# Patient Record
Sex: Male | Born: 1950 | Race: White | Hispanic: No | Marital: Married
Health system: Midwestern US, Community
[De-identification: ages and names within clinical notes are randomized; demographics above are authoritative.]

## PROBLEM LIST (undated history)

## (undated) DIAGNOSIS — K219 Gastro-esophageal reflux disease without esophagitis: Secondary | ICD-10-CM

## (undated) DIAGNOSIS — E785 Hyperlipidemia, unspecified: Secondary | ICD-10-CM

## (undated) DIAGNOSIS — E7800 Pure hypercholesterolemia, unspecified: Secondary | ICD-10-CM

## (undated) DIAGNOSIS — I2510 Atherosclerotic heart disease of native coronary artery without angina pectoris: Secondary | ICD-10-CM

## (undated) DIAGNOSIS — I10 Essential (primary) hypertension: Secondary | ICD-10-CM

## (undated) DIAGNOSIS — M51369 Other intervertebral disc degeneration, lumbar region without mention of lumbar back pain or lower extremity pain: Secondary | ICD-10-CM

## (undated) DIAGNOSIS — R609 Edema, unspecified: Secondary | ICD-10-CM

## (undated) DIAGNOSIS — M5136 Other intervertebral disc degeneration, lumbar region: Secondary | ICD-10-CM

## (undated) DIAGNOSIS — M79605 Pain in left leg: Secondary | ICD-10-CM

## (undated) DIAGNOSIS — M5116 Intervertebral disc disorders with radiculopathy, lumbar region: Principal | ICD-10-CM

## (undated) DIAGNOSIS — I209 Angina pectoris, unspecified: Secondary | ICD-10-CM

## (undated) DIAGNOSIS — R42 Dizziness and giddiness: Secondary | ICD-10-CM

## (undated) DIAGNOSIS — M545 Low back pain, unspecified: Secondary | ICD-10-CM

---

## 2011-05-28 NOTE — Telephone Encounter (Signed)
Per Ginger Reddick, RN, instruct patient he would have to go to the emergency room since he has not been seen by a Provider at our office.  She reported Dr. Doylene Canard was at [facility] this am, therefore it was a possibility he might would see him in the ER.   Also advised his PCP could check about rescheduling for earlier stress test appt, or he could come to the ER at [facility].

## 2011-05-28 NOTE — Telephone Encounter (Signed)
Patient states he has Stress Test scheduled for 06/06/11 at 9:30 am with Dr. Chase Picket.  He feels he does not need to wait that long to find out what is causing his chest pain.  He states he has been having chest pain radiating into his left arm and jaw, but only with physical exertion.  However last night had chest pain while lying in bed.  He reports taking a nitroglycerin tablet SL and pain went away after 4-5 minutes.  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  Informed I would check with Dr. Wende Bushy nurse and return call

## 2011-05-29 ENCOUNTER — Inpatient Hospital Stay: Admit: 2011-05-29 | Disposition: A | Discharge status: Home / Self Care | Attending: Family | Admitting: Family

## 2011-05-29 LAB — CBC PANEL ITEM
Basophils %: 0.7 % (ref 0–1)
Basophils: 0.05 10*3/uL (ref 0.0–0.20)
Eosinophils %: 0.04 10*3/uL (ref 0.0–0.60)
Eosinophils %: 0.5 % — ABNORMAL LOW (ref 1–5)
Hematocrit: 43.5 % (ref 42–52)
Hemoglobin: 15.1 G/DL (ref 14.0–18.0)
Lymphocytes: 1.97 10*3/uL (ref 1.1–4.5)
Lymphocytes: 26.2 % (ref 20–40)
MCH: 30.7 PG (ref 27–31)
MCHC: 34.7 G/DL (ref 33–37)
MCV: 88.4 FL (ref 80–94)
MPV: 10 FL — ABNORMAL LOW (ref 10.2–13.2)
Monocytes %: 7.6 % (ref 0–10)
Monocytes: 0.57 10*3/uL (ref 0.0–0.9)
Neutrophils %: 65 % (ref 50–70)
Neutrophils Absolute: 4.89 10*3/uL (ref 1.5–7.5)
Platelets: 188 (ref 130–400)
RBC: 4.92 MIL/uL (ref 4.7–6.1)
RDW: 13.5 % (ref 11.0–14.0)
WBC: 7.52 10*3/uL (ref 4.8–10.8)

## 2011-05-29 LAB — COMPREHENSIVE PANEL
ALT: 37 IU/L (ref 10–40)
AST: 25 IU/L (ref 8–41)
Albumin: 4.7 G/DL (ref 3.4–4.8)
Alkaline Phosphatase: 95 IU/L (ref 18–125)
Anion Gap: 5 MEQ/L — ABNORMAL LOW (ref 7–19)
BUN: 23 MG/DL — ABNORMAL HIGH (ref 6–20)
CO2: 32 MEQ/L — ABNORMAL HIGH (ref 23–29)
Calcium: 9.7 MG/DL (ref 8.5–10.3)
Chloride: 105 MEQ/L (ref 98–111)
Creatinine: 1.1 MG/DL (ref 0.3–1.3)
Gfr Calculated: 72 mL/min (ref >59)
Glucose: 103 MG/DL
Potassium: 3.8 mEq/L (ref 3.5–5.0)
Sodium: 142 MEQ/L (ref 136–145)
Total Bilirubin: 0.8 MG/DL (ref 0.2–1.2)
Total Protein: 7.3 G/DL (ref 6.4–8.3)

## 2011-05-30 LAB — CARDIAC ED PROFILE
Ck Isoenzymes: 81 IU/L (ref 22–269)
Ck Isoenzymes: 72 IU/L (ref 22–269)
Ck Isoenzymes: 82 IU/L (ref 22–269)
Myoglobin: 38 NG/ML (ref 15–110)
Myoglobin: 66 NG/ML (ref 15–110)
Myoglobin: 71 NG/ML (ref 15–110)
Troponin I: 0.1 NG/ML — ABNORMAL HIGH (ref 0.000–0.06)
Troponin I: 0.16 NG/ML — ABNORMAL HIGH (ref 0.000–0.06)
Troponin I: 0.18 NG/ML — ABNORMAL HIGH (ref 0.000–0.06)

## 2011-05-30 LAB — LIPID PANEL
Cholesterol, Total: 169 MG/DL
HDL: 36 MG/DL
LDL Direct: 109 MG/DL
Triglycerides: 124 MG/DL (ref 35–160)

## 2011-05-30 LAB — PROTIME-INR
INR: 1.07 (ref 0.90–1.10)
Protime: 13.5 s (ref 11.5–14.1)

## 2011-05-30 LAB — C-REACTIVE PROTEIN: CRP: 0.04 MG/DL (ref 0–0.5)

## 2011-05-30 LAB — LACTATE DEHYDROGENASE: LD: 160 IU/L (ref 91–215)

## 2011-05-30 LAB — HEMOGLOBIN A1C: %HBA1C: 6 % (ref 4.0–6.0)

## 2011-05-31 LAB — CARDIAC ED PROFILE
Ck Isoenzymes: 66 IU/L (ref 22–269)
Myoglobin: 49 NG/ML (ref 15–110)
Troponin I: 0.13 NG/ML — ABNORMAL HIGH (ref 0.000–0.06)

## 2011-06-01 LAB — URINALYSIS
Bilirubin Urine: NEGATIVE
Glucose, Ur: NEGATIVE MG/DL
Ketones, Urine: NEGATIVE MG/DL
Leukocyte Esterase, Urine: NEGATIVE
Nitrite, Urine: NEGATIVE
Occult Blood,Urine: NEGATIVE
Protein, UA: NEGATIVE MG/DL
Specific Gravity, Urine: 1.025 (ref 1.001–1.035)
Urobilinogen, Urine: 1 EU/DL
pH, UA: 6.5

## 2011-06-01 LAB — CBC PANEL ITEM
Basophils %: 0.9 % (ref 0–1)
Basophils: 0.06 10*3/uL (ref 0.0–0.20)
Eosinophils %: 0.17 10*3/uL (ref 0.0–0.60)
Eosinophils %: 2.6 % (ref 1–5)
Hematocrit: 42.5 % (ref 42–52)
Hemoglobin: 14.8 G/DL (ref 14.0–18.0)
Lymphocytes: 2.01 10*3/uL (ref 1.1–4.5)
Lymphocytes: 30.6 % (ref 20–40)
MCH: 31 PG (ref 27–31)
MCHC: 34.8 G/DL (ref 33–37)
MCV: 88.9 FL (ref 80–94)
MPV: 10.5 FL (ref 10.2–13.2)
Monocytes %: 8.7 % (ref 0–10)
Monocytes: 0.57 10*3/uL (ref 0.0–0.9)
Neutrophils %: 57.2 % (ref 50–70)
Neutrophils Absolute: 3.75 10*3/uL (ref 1.5–7.5)
Platelets: 171 (ref 130–400)
RBC: 4.78 MIL/uL (ref 4.7–6.1)
RDW: 13.1 % (ref 11.0–14.0)
WBC: 6.56 10*3/uL (ref 4.8–10.8)

## 2011-06-01 LAB — CARDIAC ED PROFILE
Ck Isoenzymes: 49 IU/L (ref 22–269)
Myoglobin: 41 NG/ML (ref 15–110)
Troponin I: 0.12 NG/ML — ABNORMAL HIGH (ref 0.000–0.06)

## 2011-06-02 LAB — BLOOD GAS, ARTERIAL
Base: -4.6 — ABNORMAL LOW (ref <=2.0)
Base: -5.9 — ABNORMAL LOW (ref <=2.0)
Base: -8.9 — ABNORMAL LOW (ref <=2.0)
CALCULATED TOTAL (E+NE): 11.4 g/dL — ABNORMAL LOW (ref 14.0–18.0)
CALCULATED TOTAL (E+NE): 11.3 g/dL — ABNORMAL LOW (ref 14.0–18.0)
CALCULATED TOTAL (E+NE): 12.4 g/dL — ABNORMAL LOW (ref 14.0–18.0)
Carboxyhemoglobin: 0.9 % (ref 0–5)
Carboxyhemoglobin: 0.3 % (ref 0–5)
Carboxyhemoglobin: 0.1 % (ref 0–5)
FIO2: 15
FIO2: 3
HCO3: 22.5 mM/L (ref 22–26)
HCO3: 21.1 mM/L — ABNORMAL LOW (ref 22–26)
HCO3: 18.4 mM/L — ABNORMAL LOW (ref 22–26)
Met HB: 0.6 % (ref 0–3)
Met HB: 1.1 % (ref 0–3)
Met HB: 0.7 % (ref 0–3)
O2 Sat: 15.6 % — ABNORMAL LOW (ref 17–21)
O2 Sat: 15.3 % — ABNORMAL LOW (ref 17–21)
O2 Sat: 16.7 % — ABNORMAL LOW (ref 17–21)
PCO2: 49 mmHg — ABNORMAL HIGH (ref 35–48)
PCO2: 47 mmHg (ref 35–48)
PCO2: 44 mmHg (ref 35–48)
PO2: 283 mmHg — ABNORMAL HIGH (ref 83–108)
PO2: 147 mmHg — ABNORMAL HIGH (ref 83–108)
PO2: 100 mmHg (ref 83–108)
PT Status: 2
Ph: 7.27 — CL (ref 7.35–7.45)
Ph: 7.26 — CL (ref 7.35–7.45)
Ph: 7.23 — CL (ref 7.35–7.45)
Potassium, Bld: 4.2 mEq/L (ref 3.5–5.0)
Potassium, Bld: 4.4 mEq/L (ref 3.5–5.0)
Potassium, Bld: 4.5 mEq/L (ref 3.5–5.0)
Rate: 18
Rate: 19
Rate: 14
Sat: 98.5 % (ref 90–100)
Sat: 97.5 % (ref 90–100)
Sat: 97.3 % (ref 90–100)

## 2011-06-02 LAB — EG7+
Base Excess (Calculated): -1 mmol/L (ref ?–2)
Base Excess (Calculated): -1 mmol/L (ref ?–2)
Base Excess (Calculated): -2 mmol/L (ref ?–2)
Base Excess (Calculated): 1 mmol/L (ref ?–2)
HCO3: 23.3 mmol/L (ref 22–26)
HCO3: 23.6 mmol/L (ref 22–26)
HCO3: 24 mmol/L (ref 22–26)
HCO3: 25.2 mmol/L (ref 22–26)
Hematocrit: 33 %PCV (ref 37–52)
Hematocrit: 38 %PCV (ref 37–52)
Hematocrit: 39 %PCV (ref 37–52)
Hematocrit: 39 %PCV (ref 37–52)
Hemoglobin: 11.2 g/dL (ref 12–18)
Hemoglobin: 12.9 g/dL (ref 12–18)
Hemoglobin: 13.3 g/dL (ref 12–18)
Hemoglobin: 13.3 g/dL (ref 12–18)
O2 Sat: 100 % (ref 95–98)
O2 Sat: 100 % (ref 95–98)
O2 Sat: 100 % (ref 95–98)
O2 Sat: 99 % (ref 95–98)
PCO2: 35.1 mmHg (ref 35–48)
PCO2: 36.8 mmHg (ref 35–48)
PCO2: 40.7 mmHg (ref 35–48)
PCO2: 43.9 mmHg (ref 35–48)
POC Ionized Calcium: 1 mmol/L (ref 1.10–1.30)
POC Ionized Calcium: 1.05 mmol/L (ref 1.10–1.30)
POC Ionized Calcium: 1.06 mmol/L (ref 1.10–1.30)
POC Ionized Calcium: 1.14 mmol/L (ref 1.10–1.30)
Potassium: 3.5 mEq/L (ref 3.5–5.0)
Potassium: 3.6 mEq/L (ref 3.5–5.0)
Potassium: 3.7 mEq/L (ref 3.5–5.0)
Potassium: 3.9 mEq/L (ref 3.5–5.0)
Sodium: 135 mEq/L (ref 136–145)
Sodium: 136 mEq/L (ref 136–145)
Sodium: 137 mEq/L (ref 136–145)
Sodium: 138 mEq/L (ref 136–145)
TC02 (Calc), Ven: 24 mmol/L (ref 23–29)
TC02 (Calc), Ven: 25 mmol/L (ref 23–29)
TC02 (Calc), Ven: 25 mmol/L (ref 23–29)
TC02 (Calc), Ven: 26 mmol/L (ref 23–29)
pH:: 7.339 (ref 7.35–7.45)
pH:: 7.38 (ref 7.35–7.45)
pH:: 7.408 (ref 7.35–7.45)
pH:: 7.465 (ref 7.35–7.45)
pO2, Arterial: 163 mmHg (ref 83–108)
pO2, Arterial: 326 mmHg (ref 83–108)
pO2, Arterial: 332 mmHg (ref 83–108)
pO2, Arterial: 502 mmHg (ref 83–108)

## 2011-06-02 LAB — BASIC METABOLIC PANEL
Anion Gap: 1 MEQ/L — ABNORMAL LOW (ref 7–19)
Anion Gap: 4 MEQ/L — ABNORMAL LOW (ref 7–19)
BUN: 14 MG/DL (ref 6–20)
BUN: 16 MG/DL (ref 6–20)
CO2: 24 MEQ/L (ref 23–29)
CO2: 28 MEQ/L (ref 23–29)
Calcium: 7.9 MG/DL — ABNORMAL LOW (ref 8.5–10.3)
Calcium: 8.7 MG/DL (ref 8.5–10.3)
Chloride: 110 MEQ/L (ref 98–111)
Chloride: 110 MEQ/L (ref 98–111)
Creatinine: 0.9 MG/DL (ref 0.3–1.3)
Creatinine: 0.9 MG/DL (ref 0.3–1.3)
Gfr Calculated: 91 mL/min (ref >59)
Gfr Calculated: 91 mL/min (ref >59)
Glucose: 144 MG/DL
Glucose: 159 MG/DL
Potassium: 4.5 mEq/L (ref 3.5–5.0)
Potassium: 4.7 mEq/L (ref 3.5–5.0)
Sodium: 138 MEQ/L (ref 136–145)
Sodium: 139 MEQ/L (ref 136–145)

## 2011-06-02 LAB — CBC PANEL ITEM
Basophils %: 0.3 % (ref 0–1)
Basophils: 0.06 10*3/uL (ref 0.0–0.20)
Eosinophils %: 0.1 10*3/uL (ref 0.0–0.60)
Eosinophils %: 0.5 % — ABNORMAL LOW (ref 1–5)
Hematocrit: 30.7 % — ABNORMAL LOW (ref 42–52)
Hemoglobin: 10.6 G/DL — ABNORMAL LOW (ref 14.0–18.0)
Lymphocytes: 13.4 % — ABNORMAL LOW (ref 20–40)
Lymphocytes: 2.63 10*3/uL (ref 1.1–4.5)
MCH: 31.1 PG — ABNORMAL HIGH (ref 27–31)
MCHC: 34.5 G/DL (ref 33–37)
MCV: 90 FL (ref 80–94)
MPV: 10.1 FL — ABNORMAL LOW (ref 10.2–13.2)
Monocytes %: 7.3 % (ref 0–10)
Monocytes: 1.43 10*3/uL — ABNORMAL HIGH (ref 0.0–0.9)
Neutrophils %: 78.5 % — ABNORMAL HIGH (ref 50–70)
Neutrophils Absolute: 15.36 10*3/uL — ABNORMAL HIGH (ref 1.5–7.5)
Platelets: 214 (ref 130–400)
RBC: 3.41 MIL/uL — ABNORMAL LOW (ref 4.7–6.1)
RDW: 13 % (ref 11.0–14.0)
WBC: 19.58 10*3/uL — ABNORMAL HIGH (ref 4.8–10.8)

## 2011-06-02 LAB — APTT: PTT: 29.9 s (ref 23.4–36.4)

## 2011-06-02 LAB — HEMOGLOBIN AND HEMATOCRIT
Hematocrit: 31.2 % — ABNORMAL LOW (ref 42–52)
Hemoglobin: 10.7 G/DL — ABNORMAL LOW (ref 14.0–18.0)

## 2011-06-02 LAB — PROTIME-INR
INR: 1.42 — ABNORMAL HIGH (ref 0.90–1.10)
Protime: 16.6 s — ABNORMAL HIGH (ref 11.5–14.1)

## 2011-06-02 LAB — GLUCOSE TOLERANCE TEST: Glucose Accuchek: 119 MG/DL

## 2011-06-03 LAB — BLOOD GAS, ARTERIAL
Base: -3.9 — ABNORMAL LOW (ref <=2.0)
CALCULATED TOTAL (E+NE): 9.7 g/dL — ABNORMAL LOW (ref 14.0–18.0)
Carboxyhemoglobin: 1.1 % (ref 0–5)
HCO3: 22.2 mM/L (ref 22–26)
Met HB: 0.9 % (ref 0–3)
O2 Sat: 13.1 % — ABNORMAL LOW (ref 17–21)
PCO2: 44 mmHg (ref 35–48)
PO2: 91 mmHg (ref 83–108)
Ph: 7.31 — ABNORMAL LOW (ref 7.35–7.45)
Potassium, Bld: 4.2 mEq/L (ref 3.5–5.0)
Rate: 16
Sat: 96.6 % (ref 90–100)

## 2011-06-03 LAB — BASIC METABOLIC PANEL
Anion Gap: 4 MEQ/L — ABNORMAL LOW (ref 7–19)
BUN: 15 MG/DL (ref 6–20)
CO2: 25 MEQ/L (ref 23–29)
Calcium: 7.9 MG/DL — ABNORMAL LOW (ref 8.5–10.3)
Chloride: 109 MEQ/L (ref 98–111)
Creatinine: 1 MG/DL (ref 0.3–1.3)
Gfr Calculated: 81 mL/min (ref >59)
Glucose: 140 MG/DL
Potassium: 4.4 mEq/L (ref 3.5–5.0)
Sodium: 138 MEQ/L (ref 136–145)

## 2011-06-03 LAB — CBC PANEL ITEM
Basophils %: 0.1 % (ref 0–1)
Basophils: 0.01 10*3/uL (ref 0.0–0.20)
Eosinophils %: 0 % — ABNORMAL LOW (ref 1–5)
Eosinophils %: 0 10*3/uL — ABNORMAL LOW (ref 0.0–0.60)
Hematocrit: 26.6 % — ABNORMAL LOW (ref 42–52)
Hemoglobin: 9 G/DL — ABNORMAL LOW (ref 14.0–18.0)
Lymphocytes: 1.25 10*3/uL (ref 1.1–4.5)
Lymphocytes: 11.4 % — ABNORMAL LOW (ref 20–40)
MCH: 30.6 PG (ref 27–31)
MCHC: 33.8 G/DL (ref 33–37)
MCV: 90.5 FL (ref 80–94)
MPV: 10.1 FL — ABNORMAL LOW (ref 10.2–13.2)
Monocytes %: 7.6 % (ref 0–10)
Monocytes: 0.83 10*3/uL (ref 0.0–0.9)
Neutrophils %: 80.9 % — ABNORMAL HIGH (ref 50–70)
Neutrophils Absolute: 8.9 10*3/uL — ABNORMAL HIGH (ref 1.5–7.5)
Platelets: 162 (ref 130–400)
RBC: 2.94 MIL/uL — ABNORMAL LOW (ref 4.7–6.1)
RDW: 13.7 % (ref 11.0–14.0)
WBC: 10.99 10*3/uL — ABNORMAL HIGH (ref 4.8–10.8)

## 2011-06-03 LAB — AST: AST: 47 IU/L — ABNORMAL HIGH (ref 8–41)

## 2011-06-03 LAB — GLUCOSE TOLERANCE TEST
Glucose Accuchek: 105 MG/DL
Glucose Accuchek: 130 MG/DL
Glucose Accuchek: 131 MG/DL
Glucose Accuchek: 145 MG/DL
Glucose Accuchek: 160 MG/DL
Glucose Accuchek: 80 MG/DL
Glucose Accuchek: 116 MG/DL
Glucose Accuchek: 112 MG/DL
Glucose Accuchek: 110 MG/DL
Glucose Accuchek: 126 MG/DL

## 2011-06-03 LAB — LACTATE DEHYDROGENASE: LD: 166 IU/L (ref 91–215)

## 2011-06-03 LAB — TROPONIN: Troponin I: 0.5 NG/ML — ABNORMAL HIGH (ref 0.000–0.06)

## 2011-06-03 LAB — CK: Ck Isoenzymes: 994 IU/L — ABNORMAL HIGH (ref 22–269)

## 2011-06-04 LAB — COMPREHENSIVE PANEL
ALT: 40 IU/L (ref 10–40)
AST: 50 IU/L — ABNORMAL HIGH (ref 8–41)
Albumin: 3 G/DL — ABNORMAL LOW (ref 3.4–4.8)
Alkaline Phosphatase: 49 IU/L (ref 18–125)
Anion Gap: 2 MEQ/L — ABNORMAL LOW (ref 7–19)
BUN: 12 MG/DL (ref 6–20)
CO2: 29 MEQ/L (ref 23–29)
Calcium: 8.3 MG/DL — ABNORMAL LOW (ref 8.5–10.3)
Chloride: 110 MEQ/L (ref 98–111)
Creatinine: 0.9 MG/DL (ref 0.3–1.3)
Gfr Calculated: 91 mL/min (ref >59)
Glucose: 112 MG/DL
Potassium: 3.6 mEq/L (ref 3.5–5.0)
Sodium: 141 MEQ/L (ref 136–145)
Total Bilirubin: 0.3 MG/DL (ref 0.2–1.2)
Total Protein: 4.5 G/DL — ABNORMAL LOW (ref 6.4–8.3)

## 2011-06-04 LAB — CBC PANEL ITEM
Basophils %: 0.3 % (ref 0–1)
Basophils: 0.02 10*3/uL (ref 0.0–0.20)
Eosinophils %: 0.03 10*3/uL (ref 0.0–0.60)
Eosinophils %: 0.5 % — ABNORMAL LOW (ref 1–5)
Hematocrit: 21.3 % — ABNORMAL LOW (ref 42–52)
Hemoglobin: 7.1 G/DL — ABNORMAL LOW (ref 14.0–18.0)
Lymphocytes: 1.4 10*3/uL (ref 1.1–4.5)
Lymphocytes: 22.5 % (ref 20–40)
MCH: 30.1 PG (ref 27–31)
MCHC: 33.3 G/DL (ref 33–37)
MCV: 90.3 FL (ref 80–94)
MPV: 10 FL — ABNORMAL LOW (ref 10.2–13.2)
Monocytes %: 9.8 % (ref 0–10)
Monocytes: 0.61 10*3/uL (ref 0.0–0.9)
Neutrophils %: 66.9 % (ref 50–70)
Neutrophils Absolute: 4.15 10*3/uL (ref 1.5–7.5)
Platelets: 127 — ABNORMAL LOW (ref 130–400)
RBC: 2.36 MIL/uL — ABNORMAL LOW (ref 4.7–6.1)
RDW: 14 % (ref 11.0–14.0)
WBC: 6.21 10*3/uL (ref 4.8–10.8)

## 2011-06-04 LAB — GLUCOSE TOLERANCE TEST
Glucose Accuchek: 134 MG/DL
Glucose Accuchek: 114 MG/DL
Glucose Accuchek: 110 MG/DL
Glucose Accuchek: 163 MG/DL
Glucose Accuchek: 153 MG/DL

## 2011-06-04 LAB — ANTIBODY SCREEN/XM

## 2011-06-04 LAB — RH PHENOTYPING
ABO Check: A POS
ABO Check: A POS

## 2011-06-05 ENCOUNTER — Encounter

## 2011-06-05 LAB — BASIC METABOLIC PANEL
Anion Gap: 6 MEQ/L — ABNORMAL LOW (ref 7–19)
BUN: 15 MG/DL (ref 6–20)
CO2: 32 MEQ/L — ABNORMAL HIGH (ref 23–29)
Calcium: 8.4 MG/DL — ABNORMAL LOW (ref 8.5–10.3)
Chloride: 105 MEQ/L (ref 98–111)
Creatinine: 1 MG/DL (ref 0.3–1.3)
Gfr Calculated: 81 mL/min (ref >59)
Glucose: 109 MG/DL
Potassium: 3.6 mEq/L (ref 3.5–5.0)
Sodium: 143 MEQ/L (ref 136–145)

## 2011-06-05 LAB — CBC PANEL ITEM
Basophils %: 0.5 % (ref 0–1)
Basophils: 0.03 10*3/uL (ref 0.0–0.20)
Eosinophils %: 0.13 10*3/uL (ref 0.0–0.60)
Eosinophils %: 2.1 % (ref 1–5)
Hematocrit: 26.2 % — ABNORMAL LOW (ref 42–52)
Hemoglobin: 8.8 G/DL — ABNORMAL LOW (ref 14.0–18.0)
Lymphocytes: 1.52 10*3/uL (ref 1.1–4.5)
Lymphocytes: 24.5 % (ref 20–40)
MCH: 29.5 PG (ref 27–31)
MCHC: 33.6 G/DL (ref 33–37)
MCV: 87.9 FL (ref 80–94)
MPV: 10 FL — ABNORMAL LOW (ref 10.2–13.2)
Monocytes %: 11.1 % — ABNORMAL HIGH (ref 0–10)
Monocytes: 0.69 10*3/uL (ref 0.0–0.9)
Neutrophils %: 61.8 % (ref 50–70)
Neutrophils Absolute: 3.84 10*3/uL (ref 1.5–7.5)
Platelets: 135 (ref 130–400)
RBC: 2.98 MIL/uL — ABNORMAL LOW (ref 4.7–6.1)
RDW: 15.1 % — ABNORMAL HIGH (ref 11.0–14.0)
WBC: 6.21 10*3/uL (ref 4.8–10.8)

## 2011-06-05 LAB — GLUCOSE TOLERANCE TEST
Glucose Accuchek: 164 MG/DL
Glucose Accuchek: 111 MG/DL
Glucose Accuchek: 133 MG/DL

## 2011-06-05 MED ORDER — ROSUVASTATIN CALCIUM 10 MG PO TABS
10 MG | ORAL_TABLET | Freq: Every evening | ORAL | Status: DC
Start: 2011-06-05 — End: 2011-07-14

## 2011-06-05 NOTE — Progress Notes (Signed)
Received notification that Livalo needed prior authorization. Spoke with [employer] at [facility] - explained that Mr. Bernerd PhoKuhens had severe leg pain, unable to get out of bed when he took Simvastatin in the past. Jay SchlichterElmer said based on criteria, Mr. Bernerd PhoKuhens would have to try Crestor first.     Will d/c Livalo and send RX for crestor 10 mg daily to [facility].

## 2011-06-17 NOTE — Patient Instructions (Signed)
Hold Lisinopril and Lopressor  Check Blood Pressure daily

## 2011-06-17 NOTE — Progress Notes (Signed)
Mr. Richard Sims comes in today for evaluation of dizziness and weak. Checked pulse last night - 64.     Blood pressure 137/93, pulse 93, resp. rate 20, SpO2 99.00%.  Chest: clear, decreased left. Left anterior thoracotomy incision clean, moderate amount bruising and swelling.  HT: Regular rate and rhythm. No murmurs, gallops or rubs    Plan: Hold lisinopril and lopressor for now.            Call in a few days with update of symptoms and blood pressure.

## 2011-07-08 ENCOUNTER — Encounter

## 2011-07-08 NOTE — Progress Notes (Addendum)
[facility]  [facility]  [address], [address], [place], [place] [address]  Phone: [phone]  Fax: [phone]      Office Visit:  07/08/2011    Richard Sims - DOB: 10-31-1950    Reason For Visit:  Richard Sims is a 60 y.o. male who is here for Post-Op Check  S/P Hybrid MICS CABG X 1, Stent to RCA    Subjective       I had the pleasure of seeing Richard Sims in the office today. As you know, Richard Sims is a 60 y.o. male who presented with crescendo angina symptoms. A positive stress test was noted; therefore, Richard Sims underwent elective cardiac catheterization. This demonstrated a left ventricle that showed normal function with ejection fraction estimated at 70%. The left main artery had no significant disease. The LAD had a proximal complex 90% stenosis after a large proximal LAD aneurysm. The left circumflex system has a large intermediate branch with a fair obtuse marginal branch. The RCA has a proximal 99% subtotal occlusion at the RV branch. Richard Sims underwent hybrid revascularization, with minimally invasive coronary surgery (MICS), placing the left internal mammary artery to the left anterior descending artery via left mini-thoracotomy followed with PCI and drug-eluting stent placement to the proximal RCA per Dr. [name] on 06/02/2011.    Richard Sims did well during the postoperative course. Richard Sims suffered no complications and was discharged home on 06/05/2011.     Since his return home, Richard Sims has continued to increase his ambulation as much as possible, now walking several times per day. His appetite is improving and Richard Sims otherwise has the normal amount of postoperative discomfort.    ALLERGIES: Simvastatin    Medications:   1. Plavix 75 mg daily   2. ASA 81 mg daily   3. Metoprolol 12.5 mg nightly   4. Crestor 10 mg every evening    Richard Sims is to continue the Plavix per Dr. Sandi Mariscal.    Review of Systems:   General: Denies any fever or chills. Energy level and endurance are  returning to                              normal.    Respiratory: Denies any cough or hoarseness. Denies any SOB or DOE.   Cardiac: Denies any chest pain or pressure. Denies any palpitations. Denies any                             presyncope or syncope. Denies any orthopnea or PND.     Physical Examination:   Vital signs: Blood pressure 143/91, pulse 94, resp. rate 20, height 5\' 7"  (1.702 m), weight 186 lb (84.369 kg), SpO2 99.00%.   Lungs: Both lungs are clear with equal breath sounds.   Cardiac: Demonstrates a regular rate and rhythm without murmurs, rubs, or                              gallops.    Chest: The left anterior thoracotomy incision has healed well.              Lower Extremities: No edema.     Assessment:   At this time, Richard Sims is progressing very well following his hybrid revascularization procedure.        Plan:  Richard Sims is also advised to begin the cardiac rehab program as soon as possible.    At this time, I will defer medical management to your discretion. Please call me if you have any questions regarding his care.

## 2011-07-08 NOTE — Patient Instructions (Signed)
We are committed to providing you with the best care possible.   In order to help [place] achieve these goals please remember to bring all medications, herbal products, and over the counter supplements with you to each visit.     If your provider has ordered testing for you, please be sure to follow up with our office if you have not received results within 7 days after the testing took place.     *If you receive a survey after visiting one of our offices, please take time to share your experience concerning your physician office visit. These surveys are confidential and no health information about you is shared.  We are eager to improve for you and we are counting on your feedback to help make that happen.  A copy of the American Heart Association low cholesterol diet is provided, and the diet is discussed with the patient.   Patient was instructed to begin cardiac rehab. Pamphlet was provided. Cardiovascular nutrition handout also provided.

## 2011-07-14 MED ORDER — CLOPIDOGREL BISULFATE 75 MG PO TABS
75 MG | ORAL_TABLET | Freq: Every day | ORAL | Status: DC
Start: 2011-07-14 — End: 2012-08-02

## 2011-07-14 MED ORDER — METOPROLOL TARTRATE 25 MG PO TABS
25 MG | ORAL_TABLET | Freq: Every evening | ORAL | Status: DC
Start: 2011-07-14 — End: 2012-01-12

## 2011-07-14 NOTE — Patient Instructions (Addendum)
1. Continue all your current medications.  2. You will need to stay on plavix for one year.  3. You will need to have your cholesterol rechecked in one month (you were given orders for this).  4. Continue Cardiac Rehab.  5. Call with any questions, problems or concerns.  6. Follow up with your primary care provider for other health concerns.  7. Continue a heart healthy diet.  8. Exercise as tolerated.   9. Follow up with Dr. Sandi Mariscal as scheduled.    You have been scheduled for an ultrasound of your neck arteries on Monday 07/21/11 @ 11:00 am.  Please check in at the [facility] after Cardiac Rehab.  You may eat and take your medications prior to the test.  Learning About Coronary Artery Disease (CAD)  What is coronary artery disease?  Coronary artery disease (CAD) occurs when the arteries that bring oxygen-rich blood to your heart become narrow or blocked. This usually happens when plaque builds up in the arteries. Plaque is fatty material made of cholesterol, calcium, and other substances in the blood.  What happens when you have coronary artery disease?   Narrowed arteries cause poor blood flow, which can lead to chest pain or discomfort. If blood flow is completely blocked, you could have a heart attack.   You can slow CAD and reduce the risk of future problems by making changes in your lifestyle, such as quitting smoking and eating heart-healthy foods.   Treatments for CAD, along with changes in your lifestyle, can help you live a longer and healthier life.  What are the symptoms?  The most common symptoms of coronary artery disease are:   Chest pain or discomfort, also called angina. These feelings may start in or spread to the left shoulder, neck, or lower jaw.   Shortness of breath when exercising or during another vigorous activity.  Others symptoms include:   A fast heartbeat.   Weakness, dizziness, and feeling sick to your stomach (nausea).   Increased sweating.  How can you  prevent coronary artery disease?   Do not smoke. It may be the best thing you can do to prevent heart disease. If you need help quitting, talk to your doctor about stop-smoking programs and medicines. These can increase your chances of quitting for good.   Be active. Get at least 30 minutes of exercise on most days of the week. Walking is a good choice. You also may want to do other activities, such as running, swimming, cycling, or playing tennis or team sports.   Eat heart-healthy foods. Eat more fruits and vegetables and less foods that contain fat and cholesterol. Limit salt and alcohol.   Control your cholesterol and blood pressure. Activity and eating heart-healthy foods can help you do this. You might take medicines too.   Talk to your doctor about taking a daily aspirin.   Manage stress. Stress can hurt your heart. Keep stress low by talking about your problems and feelings, rather than keeping your feelings hidden.  How is coronary artery disease treated?   Your doctor will suggest making lifestyle changes. For example, your doctor may ask you to eat healthy foods, quit smoking, lose extra weight, and be more active.   You will have to take medicines.   Your doctor may suggest procedures such as opening blocked arteries (angioplasty) or using healthy blood vessels to create detours around narrowed or blocked arteries (bypass surgery).  Follow-up care is a key part of your treatment  and safety. Be sure to make and go to all appointments, and call your doctor if you are having problems. It's also a good idea to know your test results and keep a list of the medicines you take.    Where can you learn more?    Go to [email or web address] and sign in to your MyChart account.    Enter C643 in the Search Health Information box to learn more about "Learning About Coronary Artery Disease (CAD)."    If you do not have an account, please click on the "Sign Up Now" link.     2006-2012  [employer]. Care instructions adapted under license by [facility]. This care instruction is for use with your licensed healthcare professional. If you have questions about a medical condition or this instruction, always ask your healthcare professional. [employer], Inc[employer] disclaims any warranty or liability for your use of this information.  Content Version: 9.4.94723; Last Revised: May 13, 2010

## 2011-07-14 NOTE — Progress Notes (Signed)
[facility] [place] [place]  [address], [place]  [address]  Phone: [phone]  Fax: [phone]    OFFICE VISIT:  07/14/2011    Richard Sims - DOB: 30-Jan-1951    Reason For Visit:  Richard Sims is a 60 y.o. male who is here for Follow-Up from Hospital    Overall, the patient is doing well from a cardiac standpoint.  He underwent MICS placing LIMA to LAD on 06/02/11.  He also had stenting to RCA.  The patient states "I feel great".  He is presently in cardiac rehabilitation.  He was started on Lipitor during the hospitalization and will need lab in one month.  The patient is concerned as his mother has carotid occlusive disease.  He reports some occasional dizziness without syncope or presyncope.    Subjective  Richard Sims denies exertional chest pain, shortness of breath, orthopnea, paroxysmal nocturnal dyspnea, syncope, presyncope, arrythmia, fatigue and edema.  The patient denies numbness or weakness to suggest cerebrovascular accident or transient ischemic attack.  Postive for occasional dizziness.    Richard Sims has the following history as recorded in EpicCare:    Patient Active Problem List   Diagnoses Date Noted   . S/P CABG x 1    . CAD (coronary artery disease)    . Hyperlipidemia    . Hypertension      Past Medical History   Diagnosis Date   . GERD (gastroesophageal reflux disease)    . Cervical disc disease    . CAD (coronary artery disease)    . Hyperlipidemia    . Hypertension    . Nephrolithiasis    . S/P CABG x 1      MICS with LIMA to LAD via left mini thoracotomy     Past Surgical History   Procedure Date   . Cardiac catherization 05/29/11   [facility]     EF  over 60%   . Coronary artery bypass graft 06/02/2011      HYBRID MICS CABG, LT THORACOTOMY APPROACH, LIMA-LAD, PCI DES-RCA   . Joint replacement      LEFT KNEE   . Spine surgery      ACF X 2   . Knee arthroscopy      BILATERAL     Family History   Problem Relation Age of Onset   . High Blood Pressure Mother    .  Heart Disease Father    . High Blood Pressure Father    . High Cholesterol Father    . Depression Paternal Grandmother      History   Substance Use Topics   . Smoking status: Never Smoker    . Smokeless tobacco: Never Used   . Alcohol Use: Yes      Occassional      Current Outpatient Prescriptions   Medication Sig Dispense Refill   . atorvastatin (LIPITOR) 40 MG tablet Take 40 mg by mouth daily.         . celecoxib (CELEBREX) 200 MG capsule Take 200 mg by mouth 2 times daily.         Marland Kitchen esomeprazole (NEXIUM) 40 MG capsule Take 40 mg by mouth every morning (before breakfast).         . clopidogrel (PLAVIX) 75 MG tablet Take 1 tablet by mouth daily.  90 tablet  3   . metoprolol (LOPRESSOR) 25 MG tablet Take 0.5 tablets by mouth nightly.  60 tablet  3   .  aspirin 81 MG EC tablet Take 81 mg by mouth daily.         . Polysaccharide Iron (FERREX) 150 MG CAPS Take 150 mg by mouth daily.         Marland Kitchen ketorolac (TORADOL) 10 MG tablet Take 10 mg by mouth 3 times daily. FOR 3 DAYS          Allergies: Simvastatin    Review of Systems  Constitutional - no significant activity change, appetite change, or unexpected weight change. No fever, chill or diaphoresis.  No fatigue.   HEENT - no significant rhinorrhea or epistaxis. No tinnitus or significant hearing loss.   Eyes - no sudden vision change or amaurosis.   Respiratory - no significant wheezing, stridor, apnea or cough.  No dyspnea on exertion or shortness of breath,  Cardiovascular - no chest pain, orthopnea, PND, palpitations, fast heart rate or slow heast rate.No claudication or leg edema.  Positive for occasional dizziness.  Gastrointestinal - no abdominal swelling or pain. No blood in stool. No severe constipation, diarrhea, nausea, or vomiting.   Genitourinary - No difficulty urinating, dysuria, frequency, or urgency. No flank pain or hematuria.   Musculoskeletal - no back pain, gait disturbance, or myalgia.   Skin - no color change, rash or pallor.  Left chest surgical  incision well healed without erythema, edema or drainage.  Neurologic - no speech difficulty, facial asymmetry or lateralizing weakness.  No seizures, presyncope or syncope.  Positive for occasional dizziness.  Hematologic - no easy bruising or excessive bleeding.   Psychiatric - no severe anxiety or nervousness. No confusion.   All other review of systems are negative.    Objective  Vital Signs - BP 148/62  Pulse 60  Ht 5\' 7"  (1.702 m)  Wt 185 lb (83.915 kg)  BMI 28.97 kg/m2  General - Richard Sims is alert, cooperative, and pleasant.  No acute distress.      Integument - Skin warm and dry without rash or pallor.  Left chest surgical incision well healed without erythema, edema or drainage.  HEENT - Head normocephalic and atraumatic.  Neck - Neck supple.  No jugular venous distention. No carotid bruits.   Lungs - Lungs clear to auscultation bilaterally.  No wheezes, ronchi or rales.    Cardiovascular - Heart regular rate and rhythm without rub or gallop; no murmur.  No lower extremity edema.     Abdominal -  No distention, pain or ascites.  Musculoskeletal - Normal range of motion.  Neurological - No localizing neurological deficits.   Alert and oriented to person, place and time.    Assessment:   Stable cardiovascular status.   1. Hyperlipidemia  Lipid panel, AST, ALT   2. CAD (coronary artery disease)     3. Hypertension     4. S/P CABG x 1     5.  Mild dizziness (family hx of carotid occlusive disease)    Plan  1. Continue all your current medications.  2. You will need to stay on plavix for one year.  3. You will need to have your cholesterol rechecked in one month (you were given orders for this).  4. Continue Cardiac Rehab.  5. Call with any questions, problems or concerns.  6. Follow up with your primary care provider for other health concerns.  7. Continue a heart healthy diet.  8. Exercise as tolerated.   9. Follow up with Dr. Sandi Mariscal as scheduled.    You have been scheduled for an  ultrasound of your neck  arteries on Monday 07/21/11 @ 11:00 am.  Please check in at the [facility] after Cardiac Rehab.  You may eat and take your medications prior to the test.    Aloha Gell, APRN

## 2011-07-16 ENCOUNTER — Encounter

## 2011-08-22 NOTE — Telephone Encounter (Signed)
C/o pain in legs w/ cholesterol meds niaspan 500 one every hs low fat snack 30 min prior take with asa

## 2011-08-25 ENCOUNTER — Encounter

## 2011-08-25 MED ORDER — NIACIN ER (ANTIHYPERLIPIDEMIC) 500 MG PO TBCR
500 MG | ORAL_TABLET | Freq: Every evening | ORAL | Status: DC
Start: 2011-08-25 — End: 2011-11-28

## 2011-11-03 NOTE — Patient Instructions (Signed)
Register at the [facility] located on the first floor of [facility].    Date/Time:   March 28th.   Register at 7:30 am for an 8:00 am.     Stress Echocardiogram  And Echocardiogram     This records the heart's activity during a cardiac stress test.  A stress echocardiogram is a very effective, noninvasive test that can help determine whether you have blockages in your coronary arteries.    The exam takes approximately thirty minutes.      To help ensure accurate results, patients should take the following steps in preparation for a stress echocardiogram:   Refrain from strenuous activity for 12 hours before the test.   Do not eat, drink, or smoke for two hours prior to the test.  Unless instructed otherwise by your physician, you should continue to take prescribed medications.   Wear loose, comfortable clothing and walking shoes.    If you need to change this appointment, please call outpatient scheduling at [phone].

## 2011-11-03 NOTE — Progress Notes (Signed)
[facility] [place] [place].  [facility]  [address], [place]  [address]  Phone: [phone]  Fax: [phone]  Office Visit - 11/03/2011    Richard Sims  Male, 61 y.o., DOB: [birth year]    Subjective:   Richard Sims is going through cardiac rehab and his doing well.  However, he feels short of breath with activity and wonders if this is normal.      Richard Sims has the following history as recorded in EpicCare:  Patient Active Problem List    Diagnosis Date Noted   . S/P CABG x 1    . CAD (coronary artery disease)    . Hyperlipidemia    . Hypertension      Past Medical History   Diagnosis Date   . GERD (gastroesophageal reflux disease)    . Cervical disc disease    . CAD (coronary artery disease)    . Hyperlipidemia    . Hypertension    . Nephrolithiasis    . S/P CABG x 1      MICS with LIMA to LAD via left mini thoracotomy     Past Surgical History   Procedure Laterality Date   . Cardiac catherization  05/29/11   [facility]     EF  over 60%   . Coronary artery bypass graft  06/02/2011      HYBRID MICS CABG, LT THORACOTOMY APPROACH, LIMA-LAD, PCI DES-RCA   . Joint replacement       LEFT KNEE   . Spine surgery       ACF X 2   . Knee arthroscopy       BILATERAL     Family History   Problem Relation Age of Onset   . High Blood Pressure Mother    . Heart Disease Father    . High Blood Pressure Father    . High Cholesterol Father    . Depression Paternal Grandmother      History   Substance Use Topics   . Smoking status: Never Smoker    . Smokeless tobacco: Never Used   . Alcohol Use: Yes      Occassional     Allergies:  Simvastatin  Outpatient Prescriptions Marked as Taking for the 11/03/11 encounter (Office Visit) with Baran Kuhrt. [name], MD   Medication Sig Dispense Refill   . niacin (NIASPAN) 500 MG CR tablet Take 1 tablet by mouth nightly. Take 30 minutes after a low-fat snack and with aspirin.  30 tablet  5   . pantoprazole (PROTONIX) 40 MG tablet Take 40 mg by  mouth daily.         . celecoxib (CELEBREX) 200 MG capsule Take 200 mg by mouth 2 times daily.         . clopidogrel (PLAVIX) 75 MG tablet Take 1 tablet by mouth daily.  90 tablet  3   . metoprolol (LOPRESSOR) 25 MG tablet Take 0.5 tablets by mouth nightly.  60 tablet  3   . aspirin 81 MG EC tablet Take 81 mg by mouth daily.             Objective:   BP 136/78  Pulse 84  Ht 5\' 7"  (1.702 m)  Wt 182 lb (82.555 kg)  BMI 28.5 kg/m2    GENERAL:  The patient is a pleasant 61 y.o. male in no apparent distress.   HEENT:  Unremarkable.    NECK:  No bruits or  jugular venous pressures.      LUNGS:  Lungs are clear.    HEART:  Heart has regular rate and rhythm.  No audible murmurs or gallops.      ABDOMEN:  No masses or tenderness.     EXTREMITIES:  No edema.  Peripheral pulses intact.  MUSCULOSKELETAL:  No musculoskeletal symptoms.     NEUROLOGICAL:  Cranial nerves II through XII are grossly intact.  There are no focal abnormalities.      Assessment:   Cardiac status appears stable.      Plan:   1.  Schedule stress echo and echo/Doppler for March 28th.    2.  Continue current medications.   3.  Wellness visit in six months and return to see me in a year.      Beatriz Stallion, M.D., F.A.Aleksia Freiman.Advith Martine.  [facility] [place], [place].    Cc:   [name]  [address] [place] [address]  [phone] phone  [phone] fax

## 2011-11-28 ENCOUNTER — Encounter

## 2011-11-28 MED ORDER — ROSUVASTATIN CALCIUM 10 MG PO TABS
10 MG | ORAL_TABLET | Freq: Every evening | ORAL | Status: DC
Start: 2011-11-28 — End: 2012-01-23

## 2011-11-28 NOTE — Telephone Encounter (Signed)
Attempt made to return call at home number - been signal reached on several occasions throughout the morning.  Left voice msg on patient's cell number.  Records indicated Atorvastin was discontinued 08/21/12 due to patient complaints.  He was to start on Niaspan 500 mg every night.

## 2011-11-28 NOTE — Telephone Encounter (Signed)
Letter sent to patient for him to start on Crestor 10 mg nightly then have lipid and liver labs rechecked in 2 months.  Please send Rx to [facility].

## 2011-11-28 NOTE — Telephone Encounter (Signed)
Message left from wife stating pt is having problems with atorvastatin and wants to talk to a nurse about it.

## 2011-12-08 NOTE — Telephone Encounter (Signed)
Patient stated about 4-6 days ago he started experiencing "chest pressure/tightness" and discomfort under left arm, with exertion only.   He stated the symptoms he is experiencing, simulate what he experienced prior to his surgery  .On 06/02/11, patient had a Hybrid MICS CABG X1 and stent to RCA.   He asked if what he is feeling, is normal after the type of surgery he underwent?  Could he being over exerting himself? (He is a Associate Professor).  He was changed from Astrovastin to Crestor approximately 10 days ago.  He also reports his BP has been elevating up to 170/88-90 during the day, this am BP 142/88 with HR 84, at rest.  He stated BP was running 120's/67-72.      He saw Dr. Sandi Mariscal on 11/03/11 with c/o SOB with exertion, and stress test and 2D echo done on 11/20/11.  Please advise.

## 2011-12-08 NOTE — Telephone Encounter (Signed)
Patient has nitroglycerin SL and stated nows when and how to take.  Appointment scheduled for tomorrow, 12/09/11, at 2:00 pm with Chip Boer, APRN.

## 2011-12-08 NOTE — Telephone Encounter (Signed)
Schedule follow up in the office.  Does patient have NTG sl?  If BP stays up, will need adjustment of BP medication.  Aloha Gell, APRN

## 2011-12-09 LAB — COMPREHENSIVE PANEL
ALT: 24 IU/L (ref 10–40)
AST: 26 IU/L (ref 8–41)
Albumin: 4.6 G/DL (ref 3.4–4.8)
Alkaline Phosphatase: 92 IU/L (ref 18–125)
Anion Gap: 8 MEQ/L (ref 7–19)
BUN: 21 MG/DL — ABNORMAL HIGH (ref 6–20)
CO2: 31 MEQ/L — ABNORMAL HIGH (ref 23–29)
Calcium: 9.4 MG/DL (ref 8.5–10.3)
Chloride: 105 MEQ/L (ref 98–111)
Creatinine: 1.2 MG/DL (ref 0.3–1.3)
Gfr Calculated: 65 mL/min (ref 59–300)
Glucose: 81 MG/DL
Potassium: 4 mEq/L (ref 3.5–5.0)
Sodium: 144 MEQ/L (ref 136–145)
Total Bilirubin: 0.5 MG/DL (ref 0.2–1.2)
Total Protein: 6.8 G/DL (ref 6.4–8.3)

## 2011-12-09 LAB — CBC PANEL ITEM
Basophils %: 1.2 % — ABNORMAL HIGH (ref 0–1)
Basophils: 0.08 10*3/uL (ref 0.0–0.20)
Eosinophils %: 0.15 10*3/uL (ref 0.0–0.60)
Eosinophils %: 2.3 % (ref 1–5)
Hematocrit: 41.1 % — ABNORMAL LOW (ref 42–52)
Hemoglobin: 14.2 G/DL (ref 14.0–18.0)
Lymphocytes: 1.75 10*3/uL (ref 1.1–4.5)
Lymphocytes: 27.2 % (ref 20–40)
MCH: 30.6 PG (ref 27–31)
MCHC: 34.5 G/DL (ref 33–37)
MCV: 88.6 FL (ref 80–94)
MPV: 10.6 FL (ref 10.2–13.2)
Monocytes %: 9.2 % (ref 0–10)
Monocytes: 0.59 10*3/uL (ref 0.0–0.9)
Neutrophils %: 60.1 % (ref 50–70)
Neutrophils Absolute: 3.86 10*3/uL (ref 1.5–7.5)
Platelets: 184 (ref 130–400)
RBC: 4.64 MIL/uL — ABNORMAL LOW (ref 4.7–6.1)
RDW: 13.1 % (ref 11.0–14.0)
WBC: 6.43 10*3/uL (ref 4.8–10.8)

## 2011-12-09 MED ORDER — NITROGLYCERIN 0.4 MG SL SUBL
0.4 MG | ORAL_TABLET | SUBLINGUAL | Status: DC | PRN
Start: 2011-12-09 — End: 2012-05-05

## 2011-12-09 NOTE — Progress Notes (Signed)
History and Physical    Richard Sims is a 61 y.o. male who is here for Chest Pain  Patient states having mild chest pressure and shortness of breath on exertion. Patient states that these are similar symptoms like he experienced before open heart. He states that this has worsened over the past week.    Subjective  Richard Sims denies orthopnea, paroxysmal nocturnal dyspnea, syncope, presyncope and arrythmia.  The patient denies numbness or weakness to suggest cerebrovascular accident or transient ischemic attack.    Richard Sims has the following history as recorded in [employer]:    Patient Active Problem List    Diagnosis Date Noted   . Chest pain 12/09/2011   . Shortness of breath 12/09/2011   . S/P CABG x 1    . CAD (coronary artery disease)    . Hyperlipidemia    . Hypertension      Past Medical History   Diagnosis Date   . GERD (gastroesophageal reflux disease)    . Cervical disc disease    . CAD (coronary artery disease)    . Hyperlipidemia    . Hypertension    . Nephrolithiasis    . S/P CABG x 1      MICS with LIMA to LAD via left mini thoracotomy     Past Surgical History   Procedure Laterality Date   . Cardiac catherization  05/29/11   [facility]     EF  over 60%   . Coronary artery bypass graft  06/02/2011      HYBRID MICS CABG, LT THORACOTOMY APPROACH, LIMA-LAD, PCI DES-RCA   . Joint replacement       LEFT KNEE   . Spine surgery       ACF X 2   . Knee arthroscopy       BILATERAL     Family History   Problem Relation Age of Onset   . High Blood Pressure Mother    . Heart Disease Father    . High Blood Pressure Father    . High Cholesterol Father    . Depression Paternal Grandmother      History   Substance Use Topics   . Smoking status: Never Smoker    . Smokeless tobacco: Never Used   . Alcohol Use: Yes      Occassional      Current Outpatient Prescriptions   Medication Sig Dispense Refill   . rosuvastatin (CRESTOR) 10 MG tablet Take 1 tablet by mouth nightly.  30 tablet  5   . pantoprazole  (PROTONIX) 40 MG tablet Take 40 mg by mouth daily.         . celecoxib (CELEBREX) 200 MG capsule Take 200 mg by mouth 2 times daily.         . clopidogrel (PLAVIX) 75 MG tablet Take 1 tablet by mouth daily.  90 tablet  3   . metoprolol (LOPRESSOR) 25 MG tablet Take 0.5 tablets by mouth nightly.  60 tablet  3   . aspirin 81 MG EC tablet Take 81 mg by mouth daily.         Marland Kitchen EXPIRED: Polysaccharide Iron (FERREX) 150 MG CAPS Take 150 mg by mouth daily.         Marland Kitchen EXPIRED: ketorolac (TORADOL) 10 MG tablet Take 10 mg by mouth 3 times daily. FOR 3 DAYS          No current facility-administered medications for this visit.  Allergies: Simvastatin      Review of Systems   Constitutional: Negative for fever, chills, diaphoresis, activity change, appetite change, fatigue and unexpected weight change.   HENT: Negative for nosebleeds, facial swelling, rhinorrhea and neck stiffness.    Eyes: Negative for photophobia, pain, redness and visual disturbance.   Respiratory: Negative for apnea, cough, chest tightness, shortness of breath, wheezing and stridor.    Cardiovascular: Negative for  palpitations and leg swelling. Positive chest pain along with soa with exertion.  Gastrointestinal: Negative for abdominal distention.   Genitourinary: Negative for dysuria, urgency and frequency.   Musculoskeletal: Negative for myalgias, arthralgias and gait problem.   Skin: Negative for color change, pallor, rash and wound.   Neurological: Negative for dizziness, tremors, speech difficulty, weakness and numbness.   Hematological: Does not bruise/bleed easily.   Psychiatric/Behavioral: Negative.      Physical Exam   BP 142/80  Pulse 78  Ht 5\' 7"  (1.702 m)  Wt 189 lb (85.73 kg)  BMI 29.59 kg/m2  Constitutional: He is oriented to person, place, and time. He appears well-developed and well-nourished. No distress.   HENT:   Head: Normocephalic and atraumatic.   Eyes: EOM are normal. Pupils are equal, round, and reactive to light.   Neck:  Normal range of motion. Neck supple. No JVD present.   Cardiovascular: Normal rate, regular rhythm, normal heart sounds and intact distal pulses.  Exam reveals no gallop and no friction rub.    No murmur heard.  Pulmonary/Chest: Effort normal and breath sounds normal. No respiratory distress. He has no wheezes. He has no rales. He exhibits no tenderness.   Abdominal: Bowel sounds are normal.   Musculoskeletal: Normal range of motion. He exhibits no edema.   Neurological: He is alert and oriented to person, place, and time.   Skin: Skin is warm and dry. No rash noted. He is not diaphoretic. No erythema. No pallor.   Psychiatric: He has a normal mood and affect. His behavior is normal. Thought content normal.     ASSESSMENT    1. CAD (coronary artery disease)  EKG 12 Lead   2. Hypertension     3. Hyperlipidemia     4. Chest pain     5. Shortness of breath         PLAN    There are no Patient Instructions on file for this visit.     1. Return for office visit:  2. Continue same medications.  3. Call with any problems, questions or concerns.  4. EKG-NSR  5. Chest pain-cardiac cath  6. soa-as above  7. [facility] called in  8. preop labs and education given  9. Fu as scheduled sp heart cath

## 2011-12-09 NOTE — Patient Instructions (Signed)
Pre-op labs and CXR ordered.  Pre-op education given per diet and medication.  Risks and benefits given with patient verbalization understanding.  Date and arrival time instructions given.  If indicated, hold Metformin 48 hrs prior to procedure.  If indicated,  hold Coumadin x 2 days prior to procedure.  If indicated, IVP dye allergy pretreatment (prescriptions provided).  Plan to stay approximately 4-6 hrs status post procedure unless intervention performed.  May take all morning medications with a sip of water unless otherwise directed not to.  May have light liquid diet that morning if procedure scheduled for afternoon, otherwise nothing by mouth after midnight.  Patient will need designated person to drive them home status post procedure without intervention.  *Follow up as directed status post procedure.  Please report to the front entrance of [facility] and enter the [facility] on the left at 10:30 for your 11:30 heart cath    Your testing will take approximately 30 minutes.  Once the physician has reviewed your test, you will be notified of the results.  This may take a few days.  We will forward to your primary health care provider or surgeon if indicated.   Cardiac Catheterization   (Coronary Angiography; Coronary Arteriography; Coronary Angiogram)     Definition   Cardiac catheterization is a test that uses a catheter (tube) and x-ray machine to assess the heart and its blood supply.   Reasons for Procedure   It is used to find the cause of symptoms, like chest pain, that could mean heart problems.   Cardiac catheterization helps doctors to:    Identify narrowed or clogged arteries of the heart    Measure blood pressure within the heart    Evaluate how well the heart valves and chambers function    Check heart defects    Evaluate an enlarged heart    Decide on an appropriate treatment   Possible Complications   If you are planning to have a cardiac catheterization, your  doctor will review a list of possible complications, which may include:    Bleeding at the point of the catheter insertion    Damage to arteries    Heart attack or arrhythmia (abnormal heart beats)    Allergic reaction to x-ray dye    Blood clot formation    Infection     Some factors that may increase the risk of complications include:    Allergies to medicines or x-ray dye    Obesity    Smoking    Bleeding disorder    Age: 39 or older    Recent pneumonia    Recent heart attack    Diabetes    Kidney disease   What to Expect   Prior to Procedure   Your doctor may order:    Blood and urine tests    Electrocardiogram (ECG, EKG)a test that records the heart's activity by measuring electrical currents through the heart muscle    Chest x-ray    Stress test     Talk to your doctor about your medicines. You may be asked to stop taking some medicines before the procedure, like:    Anti-inflammatory drugs (eg, ibuprofen )    Blood thinners, like or warfarin (Coumadin)    clopidogrel (Plavix)    Metformin (Glucophage) or glyburide and metformin (Glucovance)     Leading up to your procedure:    Arrange for a ride to and from the procedure.    The night before, do  not eat or drink anything after midnight.   Anesthesia   Local anesthesia will be used at the insertion site. A mild sedative may be given one hour before or through IV during the procedure. This will help you relax.   Description of the Procedure   During the procedure, you will receive IV fluids and medicines. An EKG will be monitoring your heart's activity.   You will be awake but sedated so that you will be more relaxed. Your doctor will ask you to do basic functions such as coughing, breathing out, and holding your breath. If you feel any chest pain, dizziness, nausea, tingling, or other discomfort, tell your doctor.   The area of the groin or arm where the catheter will be inserted is shaved, cleaned, and numbed. A needle will be  inserted into a blood vessel. A wire will be passed through the needle and into the blood vessel. The wire will then be guided through until it reaches your heart. A soft, flexible catheter tube will then be slipped over the wire and threaded up to your heart.   The doctor will be taking x-ray pictures during the procedure to know where the wire and catheter are. Dye will be injected into the arteries of the heart. This will make the arteries and heart show up on the x-ray images. You may feel warm during the dye injection.     Insertion of Catheter with Guide Wire        2011 [employer]   Once in place, the catheter can be used to take measurements. Blood pressure can be taken within the heart's different chambers. Blood samples may also be taken. Multiple x-ray images will be taken to look for any disease in the arteries. An aortogram may also be done at this time. This step will give a clear image of the aorta (large artery leaving the heart). Once all the tests and images are complete, the catheter will be removed.   Sometimes, the doctor will perform balloon angioplasty and stenting if he finds an area in your arteries that is narrow or clogged. These are procedures that help to open narrowed arteries.   Finally, a bandage will be placed over the groin or arm area.   How Long Will It Take?   The procedure takes about 1-2 hours. Preparation before the test will take another 1-2 hours.   How Much Will It Hurt?   Although the procedure is generally not painful, it can cause some discomfort, including:    Burning sensation (when skin at catheter insertion site is anesthetized)    Pressure when catheter is inserted or replaced with other catheters    A flushing feeling or nausea when the dye is injected    Headache    Heart palpitations     Pain medicine will be given when needed.   Average Hospital Stay   0-1 days   Postoperative Care At the [facility]    EKG and blood studies may be done.     You will likely need to lie still and flat on your back for a period of time. A pressure dressing may be placed over the area where the catheter was inserted to help prevent bleeding. It is important to follow the nurse's directions.   At Home   When you return home, do the following to help ensure a smooth recovery:    Do not drive until your doctor says it is okay.  Do not lift heavy objects or engage in strenuous exercise or sexual activity for at least 5-7 days.    Change the dressing around the incision area as instructed.    Your doctor will explain to you which medicines you can take and which ones to avoid. Take medicines as instructed.    Ice may help decrease discomfort at the insertion site. You may apply the ice for 15-20 minutes each hour, for the first few days.    To lower your risk for further complications of heart disease, you can make lifestyle changes. These include eating a healthier diet, exercising regularly, and managing stress.    Ask your doctor about when it is safe to shower, bathe, or soak in water.    Be sure to follow your doctor's instructions .   Call Your Doctor   After arriving home, contact your doctor if any of the following occurs:    Signs of infection, including fever and chills    Extreme sweating, nausea, or vomiting    Change in sensation to affected leg, including numbness, feeling cold, or change in color    Redness, swelling, increasing pain, excessive bleeding, or discharge at point of catheter insertion    Cough, shortness of breath, or difficulty breathing    Extreme pain    Chest pain    Drooping facial muscles    Changes in vision or speech    Difficulty walking or using your limbs     In case of an emergency, CALL 911 .   ardiac Catheterization   (Coronary Angiography; Coronary Arteriography; Coronary Angiogram)     Definition   Cardiac catheterization is a test that uses a catheter (tube) and x-ray machine to assess the heart and its blood  supply.   Reasons for Procedure   It is used to find the cause of symptoms, like chest pain, that could mean heart problems.   Cardiac catheterization helps doctors to:    Identify narrowed or clogged arteries of the heart    Measure blood pressure within the heart    Evaluate how well the heart valves and chambers function    Check heart defects    Evaluate an enlarged heart    Decide on an appropriate treatment   Possible Complications   If you are planning to have a cardiac catheterization, your doctor will review a list of possible complications, which may include:    Bleeding at the point of the catheter insertion    Damage to arteries    Heart attack or arrhythmia (abnormal heart beats)    Allergic reaction to x-ray dye    Blood clot formation    Infection     Some factors that may increase the risk of complications include:    Allergies to medicines or x-ray dye    Obesity    Smoking    Bleeding disorder    Age: 31 or older    Recent pneumonia    Recent heart attack    Diabetes    Kidney disease   What to Expect   Prior to Procedure   Your doctor may order:    Blood and urine tests    Electrocardiogram (ECG, EKG)a test that records the heart's activity by measuring electrical currents through the heart muscle    Chest x-ray    Stress test     Talk to your doctor about your medicines. You may be asked to stop taking some medicines before the procedure, like:  Anti-inflammatory drugs (eg, ibuprofen )    Blood thinners, like or warfarin (Coumadin)    clopidogrel (Plavix)    Metformin (Glucophage) or glyburide and metformin (Glucovance)     Leading up to your procedure:    Arrange for a ride to and from the procedure.    The night before, do not eat or drink anything after midnight.   Anesthesia   Local anesthesia will be used at the insertion site. A mild sedative may be given one hour before or through IV during the procedure. This will help you relax.   Description of the  Procedure   During the procedure, you will receive IV fluids and medicines. An EKG will be monitoring your heart's activity.   You will be awake but sedated so that you will be more relaxed. Your doctor will ask you to do basic functions such as coughing, breathing out, and holding your breath. If you feel any chest pain, dizziness, nausea, tingling, or other discomfort, tell your doctor.   The area of the groin or arm where the catheter will be inserted is shaved, cleaned, and numbed. A needle will be inserted into a blood vessel. A wire will be passed through the needle and into the blood vessel. The wire will then be guided through until it reaches your heart. A soft, flexible catheter tube will then be slipped over the wire and threaded up to your heart.   The doctor will be taking x-ray pictures during the procedure to know where the wire and catheter are. Dye will be injected into the arteries of the heart. This will make the arteries and heart show up on the x-ray images. You may feel warm during the dye injection.     Insertion of Catheter with Guide Wire        2011 [employer]   Once in place, the catheter can be used to take measurements. Blood pressure can be taken within the heart's different chambers. Blood samples may also be taken. Multiple x-ray images will be taken to look for any disease in the arteries. An aortogram may also be done at this time. This step will give a clear image of the aorta (large artery leaving the heart). Once all the tests and images are complete, the catheter will be removed.   Sometimes, the doctor will perform balloon angioplasty and stenting if he finds an area in your arteries that is narrow or clogged. These are procedures that help to open narrowed arteries.   Finally, a bandage will be placed over the groin or arm area.   How Long Will It Take?   The procedure takes about 1-2 hours. Preparation before the test will take another 1-2 hours.   How Much  Will It Hurt?   Although the procedure is generally not painful, it can cause some discomfort, including:    Burning sensation (when skin at catheter insertion site is anesthetized)    Pressure when catheter is inserted or replaced with other catheters    A flushing feeling or nausea when the dye is injected    Headache    Heart palpitations     Pain medicine will be given when needed.   Average Hospital Stay   0-1 days   Postoperative Care At the [facility]    EKG and blood studies may be done.    You will likely need to lie still and flat on your back for a period of time. A pressure  dressing may be placed over the area where the catheter was inserted to help prevent bleeding. It is important to follow the nurse's directions.   At Home   When you return home, do the following to help ensure a smooth recovery:    Do not drive until your doctor says it is okay.    Do not lift heavy objects or engage in strenuous exercise or sexual activity for at least 5-7 days.    Change the dressing around the incision area as instructed.    Your doctor will explain to you which medicines you can take and which ones to avoid. Take medicines as instructed.    Ice may help decrease discomfort at the insertion site. You may apply the ice for 15-20 minutes each hour, for the first few days.    To lower your risk for further complications of heart disease, you can make lifestyle changes. These include eating a healthier diet, exercising regularly, and managing stress.    Ask your doctor about when it is safe to shower, bathe, or soak in water.    Be sure to follow your doctor's instructions .   Call Your Doctor   After arriving home, contact your doctor if any of the following occurs:    Signs of infection, including fever and chills    Extreme sweating, nausea, or vomiting    Change in sensation to affected leg, including numbness, feeling cold, or change in color    Redness, swelling, increasing pain, excessive  bleeding, or discharge at point of catheter insertion    Cough, shortness of breath, or difficulty breathing    Extreme pain    Chest pain    Drooping facial muscles    Changes in vision or speech    Difficulty walking or using your limbs     In case of an emergency, CALL 911 .

## 2011-12-12 ENCOUNTER — Inpatient Hospital Stay
Admit: 2011-12-12 | Disposition: A | Discharge status: Home / Self Care | Attending: Cardiovascular Disease | Admitting: Cardiovascular Disease

## 2011-12-13 LAB — TROPONIN: Troponin I: 0.64 NG/ML — ABNORMAL HIGH (ref 0.000–0.06)

## 2011-12-13 LAB — CBC PANEL ITEM
Basophils %: 0.8 % (ref 0–1)
Basophils: 0.06 10*3/uL (ref 0.0–0.20)
Eosinophils %: 0.25 10*3/uL (ref 0.0–0.60)
Eosinophils %: 3.4 % (ref 1–5)
Hematocrit: 40.8 % — ABNORMAL LOW (ref 42–52)
Hemoglobin: 14.1 G/DL (ref 14.0–18.0)
Lymphocytes: 1.75 10*3/uL (ref 1.1–4.5)
Lymphocytes: 24.1 % (ref 20–40)
MCH: 30.6 PG (ref 27–31)
MCHC: 34.6 G/DL (ref 33–37)
MCV: 88.5 FL (ref 80–94)
MPV: 10.6 FL (ref 10.2–13.2)
Monocytes %: 7.9 % (ref 0–10)
Monocytes: 0.57 10*3/uL (ref 0.0–0.9)
Neutrophils %: 63.8 % (ref 50–70)
Neutrophils Absolute: 4.63 10*3/uL (ref 1.5–7.5)
Platelets: 176 (ref 130–400)
RBC: 4.61 MIL/uL — ABNORMAL LOW (ref 4.7–6.1)
RDW: 13 % (ref 11.0–14.0)
WBC: 7.26 10*3/uL (ref 4.8–10.8)

## 2011-12-13 LAB — BASIC METABOLIC PANEL
Anion Gap: 6 MEQ/L — ABNORMAL LOW (ref 7–19)
BUN: 17 MG/DL (ref 6–20)
CO2: 29 MEQ/L (ref 23–29)
Calcium: 9.1 MG/DL (ref 8.5–10.3)
Chloride: 104 MEQ/L (ref 98–111)
Creatinine: 1.1 MG/DL (ref 0.3–1.3)
Gfr Calculated: 72 mL/min (ref 59–300)
Glucose: 112 MG/DL
Potassium: 3.8 mEq/L (ref 3.5–5.0)
Sodium: 139 MEQ/L (ref 136–145)

## 2011-12-13 LAB — AST: AST: 25 IU/L (ref 8–41)

## 2011-12-13 LAB — LACTATE DEHYDROGENASE: LD: 176 IU/L (ref 91–215)

## 2011-12-13 LAB — CK: Ck Isoenzymes: 153 IU/L (ref 22–269)

## 2012-01-12 NOTE — Progress Notes (Signed)
History and Physical    Richard Sims is a 61 y.o. male who is here for Follow-Up from Hospital  Patient is sp stent. He states he is feeling very well. He states that he is back working again and doing cardiac rehab.    Subjective  Richard Sims denies exertional chest pain, shortness of breath,orthopnea, paroxysmal nocturnal dyspnea, syncope, presyncope and arrythmia.  The patient denies numbness or weakness to suggest cerebrovascular accident or transient ischemic attack.    Richard Sims has the following history as recorded in Epic[facility]:    Patient Active Problem List    Diagnosis Date Noted   . Chest pain 12/09/2011   . Shortness of breath 12/09/2011   . S/P CABG x 1    . CAD (coronary artery disease)    . Hyperlipidemia    . Hypertension      Past Medical History   Diagnosis Date   . GERD (gastroesophageal reflux disease)    . Cervical disc disease    . CAD (coronary artery disease)    . Hyperlipidemia    . Hypertension    . Nephrolithiasis    . S/P CABG x 1      MICS with LIMA to LAD via left mini thoracotomy     Past Surgical History   Procedure Laterality Date   . Cardiac catherization  05/29/11   [facility]     EF  over 60%   . Coronary artery bypass graft  06/02/2011      HYBRID MICS CABG, LT THORACOTOMY APPROACH, LIMA-LAD, PCI DES-RCA   . Joint replacement       LEFT KNEE   . Spine surgery       ACF X 2   . Knee arthroscopy       BILATERAL   . Cardiac catherization  12/12/2011  [facility]     with PTCA, stent to LAD     Family History   Problem Relation Age of Onset   . High Blood Pressure Mother    . Heart Disease Father    . High Blood Pressure Father    . High Cholesterol Father    . Depression Paternal Grandmother      History   Substance Use Topics   . Smoking status: Never Smoker    . Smokeless tobacco: Never Used   . Alcohol Use: Yes      Occassional      Current Outpatient Prescriptions   Medication Sig Dispense Refill   . metoprolol (LOPRESSOR) 25 MG tablet Take 12.5 mg by mouth 2 times daily.        . nitroGLYCERIN (NITROSTAT) 0.4 MG SL tablet Place 1 tablet under the tongue every 5 minutes as needed for Chest pain.  25 tablet  3   . rosuvastatin (CRESTOR) 10 MG tablet Take 1 tablet by mouth nightly.  30 tablet  5   . pantoprazole (PROTONIX) 40 MG tablet Take 40 mg by mouth daily.         . celecoxib (CELEBREX) 200 MG capsule Take 200 mg by mouth as needed.       . clopidogrel (PLAVIX) 75 MG tablet Take 1 tablet by mouth daily.  90 tablet  3   . aspirin 81 MG EC tablet Take 81 mg by mouth daily.         Marland Kitchen EXPIRED: Polysaccharide Iron (FERREX) 150 MG CAPS Take 150 mg by mouth daily.         Marland Kitchen  EXPIRED: ketorolac (TORADOL) 10 MG tablet Take 10 mg by mouth 3 times daily. FOR 3 DAYS          No current facility-administered medications for this visit.       Allergies: Simvastatin      Review of Systems   Constitutional: Negative for fever, chills, diaphoresis, activity change, appetite change, fatigue and unexpected weight change.   HENT: Negative for nosebleeds, facial swelling, rhinorrhea and neck stiffness.    Eyes: Negative for photophobia, pain, redness and visual disturbance.   Respiratory: Negative for apnea, cough, chest tightness, shortness of breath, wheezing and stridor.    Cardiovascular: Negative for chest pain, palpitations and leg swelling.   Gastrointestinal: Negative for abdominal distention.   Genitourinary: Negative for dysuria, urgency and frequency.   Musculoskeletal: Negative for myalgias, arthralgias and gait problem.   Skin: Negative for color change, pallor, rash and wound.   Neurological: Negative for dizziness, tremors, speech difficulty, weakness and numbness.   Hematological: Does not bruise/bleed easily.   Psychiatric/Behavioral: Negative.      Physical Exam   BP 118/62  Pulse 64  Ht 5\' 7"  (1.702 m)  Wt 187 lb (84.823 kg)  BMI 29.28 kg/m2  Constitutional: He is oriented to person, place, and time. He appears well-developed and well-nourished. No distress.   HENT:   Head:  Normocephalic and atraumatic.   Eyes: EOM are normal. Pupils are equal, round, and reactive to light.   Neck: Normal range of motion. Neck supple. No JVD present.   Cardiovascular: Normal rate, regular rhythm, normal heart sounds and intact distal pulses.  Exam reveals no gallop and no friction rub.    No murmur heard.  Pulmonary/Chest: Effort normal and breath sounds normal. No respiratory distress. He has no wheezes. He has no rales. He exhibits no tenderness.   Abdominal: Bowel sounds are normal.   Musculoskeletal: Normal range of motion. He exhibits no edema.   Neurological: He is alert and oriented to person, place, and time.   Skin: Skin is warm and dry. No rash noted. He is not diaphoretic. No erythema. No pallor.   Psychiatric: He has a normal mood and affect. His behavior is normal. Thought content normal.     ASSESSMENT    1. Hyperlipidemia    2. CAD (coronary artery disease)    3. Hypertension    4. Shortness of breath        PLAN    There are no Patient Instructions on file for this visit.     1. Return for office visit:  2. Continue same medications.  3. Call with any problems, questions or concerns.  4. CAD-cardiac protection and prevention  5. Hyperlipidemia-followed by Dr.Porter  6. HTN-CPT  7. Fu as scheduled  8. Cardiac education given

## 2012-01-12 NOTE — Patient Instructions (Addendum)
Coronary Artery Disease: After Your Visit  Your Care Instructions  The heart is a muscle, and like any muscle, it needs blood to work well. Coronary artery disease occurs when the arteries that bring oxygen-rich blood to your heart become narrow or blocked. They usually become blocked by plaque-deposits of fats and other substances. Poor blood flow can lead to chest pain and also can cause a heart attack when blood flow is completely blocked.  You can do a lot to improve your health and prevent a heart attack. Eating healthy food, not smoking, getting regular exercise, and taking your medicine are the main things you can do every day to stay healthy.  Follow-up care is a key part of your treatment and safety. Be sure to make and go to all appointments, and call your doctor if you are having problems. It's also a good idea to know your test results and keep a list of the medicines you take.  How can you care for yourself at home?  Medicines   Take your medicines exactly as prescribed. Call your doctor if you think you are having a problem with your medicine. You will get more details on the specific medicines your doctor prescribes. You may need several medicines.   Angiotensin-converting enzyme (ACE) inhibitors, angiotensin II receptor blockers (ARBs), beta-blockers, and statins can help prevent a heart attack. ACE inhibitors control your blood pressure. Statins help lower cholesterol, which is a type of fat that can clog your arteries.   Nitrates can help make chest pain happen less often.   Aspirin and other blood thinners help prevent heart attacks and strokes.   If your doctor has given you nitroglycerin for chest pain, keep it with you at all times. If you have chest pain, sit down and rest, and take the first dose of nitroglycerin as directed. If chest pain gets worse or is not getting better within 5 minutes, call 911 right away. Stay on the phone with the emergency operator; he or she will give you  further instructions.   Be sure to tell your doctor about any chest pain you have had, even if it went away.   Do not take any over-the-counter medicines, vitamins, or herbal products without talking to your doctor first.  Lifestyle   Do not smoke. Smoking can increase your risk of a heart attack or stroke. If you need help quitting, talk to your doctor about stop-smoking programs and medicines. These can increase your chances of quitting for good.   Eat a heart-healthy diet that is high in fiber and low in cholesterol, saturated fat, and salt.   Learn what a serving is. A "serving" and a "portion" are not always the same thing. Make sure that you are not eating larger portions than recommended. For example, a serving of pasta is  cup. A serving size of meat is 2 to 3 ounces; a 3-ounce serving is about the size of a deck of cards.   Eat a variety of grain products every day. Include whole-grain foods such as oats, whole wheat bread, and brown rice.   Eat fish, skinless poultry, lean meats, and soy products such as tofu instead of high-fat meats. Cut out all visible fat when you prepare meat. Soybeans may be especially good for your heart. Eat at least 2 servings of fish a week. Certain fish, such as salmon, contain omega-3 fatty acids, which may help reduce your risk of heart attack.   Eat a variety of fruit and  vegetables every day. They have lots of nutrients that help protect against heart disease, and they have little-if any-fat. Dark green, deep orange, or yellow fruits and vegetables are especially nutritious. Keep carrots, celery, and other veggies handy for snacks. Buy fruit that is in season and store it where you can see it so that you will be tempted to eat it. Cook dishes that have a lot of veggies in them, such as stir-fried dishes and soups.   Read food labels and try to avoid saturated fat and trans fat. They increase your risk of heart disease. Bake, broil, grill, or steam foods instead of  frying them. Use olive or canola oil when you cook. Try cholesterol-lowering spreads, such as Benecol or Take Control.   Limit processed foods, including cookies and crackers.   Limit drinks and foods with added sugar.   Choose low-fat or fat-free milk and dairy products.   Limit alcohol to 2 drinks a day for men and 1 drink a day for women. Too much alcohol can cause health problems.   If your doctor recommends it, get more exercise. Walking is a good choice. Bit by bit, increase the amount you walk every day. Try for at least 30 minutes on most days of the week. You also may want to swim, bike, or do other activities.   Talk to your family, friends, or a therapist about your feelings. It is normal to feel upset about having this disease and to feel afraid of having a heart attack. Talking openly about your feelings can help you cope. If you think you have symptoms of depression, talk to your doctor.   Avoid colds and flu. Get a pneumococcal vaccine shot. If you have had one before, ask your doctor whether you need a second dose. Get a flu shot every fall. If you must be around people with colds or flu, wash your hands often.  When should you call for help?  Call 911 anytime you think you may need emergency care. For example, call if:   You have symptoms of a heart attack. These may include:   Chest pain or pressure, or a strange feeling in the chest.   Sweating.   Shortness of breath.   Nausea or vomiting.   Pain, pressure, or a strange feeling in the back, neck, jaw, or upper belly or in one or both shoulders or arms.   Lightheadedness or sudden weakness.   A fast or irregular heartbeat.  After you call 911, the operator may tell you to chew 1 adult-strength or 2 to 4 low-dose aspirin. Wait for an ambulance. Do not try to drive yourself.   You have been diagnosed with angina, and you have chest pain that does not go away with rest or is not getting better within 5 minutes after you take a dose of  nitroglycerin.   You passed out (lost consciousness).  Call your doctor now or seek immediate medical care if:   You have had any chest pain, even if it has gone away.   You have new or increased shortness of breath.   You are dizzy or lightheaded, or you feel like you may faint.  Watch closely for changes in your health, and be sure to contact your doctor if you have any problems.    Where can you learn more?    Go to [email or web address] and sign in to your MyChart account.    Enter [phone] in the Search Health Information box to  learn more about "Coronary Artery Disease: After Your Visit."    If you do not have an account, please click on the "Sign Up Now" link.     2006-2012 [employer]. Care instructions adapted under license by [facility]. This care instruction is for use with your licensed healthcare professional. If you have questions about a medical condition or this instruction, always ask your healthcare professional. Health[employer], Incorporated disclaims any warranty or liability for your use of this information.  Content Version: 9.4.94723; Last Revised: May 13, 2010                DASH Diet: After Your Visit  Your Care Instructions  The DASH diet is an eating plan that can help lower your blood pressure. DASH stands for Dietary Approaches to Stop Hypertension. Hypertension is high blood pressure.  The DASH diet focuses on eating foods that are high in calcium, potassium, and magnesium. These nutrients can lower blood pressure. The foods that are highest in these nutrients are fruits, vegetables, low-fat dairy products, nuts, seeds, and legumes. But taking calcium, potassium, and magnesium supplements instead of eating foods that are high in those nutrients does not have the same effect. The DASH diet also includes whole grains, fish, and poultry.  The DASH diet is one of several lifestyle changes your doctor may recommend to lower your high blood  pressure. Your doctor may also want you to decrease the amount of sodium in your diet. Lowering sodium while following the DASH diet can lower blood pressure even further than just the DASH diet alone.  Follow-up care is a key part of your treatment and safety. Be sure to make and go to all appointments, and call your doctor if you are having problems. It's also a good idea to know your test results and keep a list of the medicines you take.  How can you care for yourself at home?  Following the DASH diet   Eat 4 to 5 servings of fruit each day. A serving is 1 medium-sized piece of fruit,  cup chopped or canned fruit, 1/4 cup dried fruit, or 4 ounces ( cup) of fruit juice. Choose fruit more often than fruit juice.   Eat 4 to 5 servings of vegetables each day. A serving is 1 cup of lettuce or raw leafy vegetables,  cup of chopped or cooked vegetables, or 4 ounces ( cup) of vegetable juice. Choose vegetables more often than vegetable juice.   Get 2 to 3 servings of low-fat and fat-free dairy each day. A serving is 8 ounces of milk, 1 cup of yogurt, or 1  ounces of cheese.   Eat 7 to 8 servings of grains each day. A serving is 1 slice of bread, 1 ounce of dry cereal, or  cup of cooked rice, pasta, or cooked cereal. Try to choose whole-grain products as much as possible.   Limit lean meat, poultry, and fish to 6 ounces each day. Six ounces is about the size of two decks of cards.   Eat 4 to 5 servings of nuts, seeds, and legumes (cooked dried beans, lentils, and split peas) each week. A serving is 1/3 cup of nuts, 2 tablespoons of seeds, or  cup cooked dried beans or peas.   Limit sweets and added sugars to 5 servings or less a week. A serving is 1 tablespoon jelly or jam,  cup sorbet, or 1 cup of lemonade.  Tips for success   Start small. Do not  try to make dramatic changes to your diet all at once. You might feel that you are missing out on your favorite foods and then be more likely to not follow the  plan. Make small changes, and stick with them. Once those changes become habit, add a few more changes.   Try some of the following:   Make it a goal to eat a fruit or vegetable at every meal and at snacks. This will make it easy to get the recommended amount of fruits and vegetables each day.   Try yogurt topped with fruit and nuts for a snack or healthy dessert.   Add lettuce, tomato, cucumber, and onion to sandwiches.   Combine a ready-made pizza crust with low-fat mozzarella cheese and lots of vegetable toppings. Try using tomatoes, squash, spinach, broccoli, carrots, cauliflower, and onions.   Have a variety of cut-up vegetables with a low-fat dip as an appetizer instead of chips and dip.   Sprinkle sunflower seeds or chopped almonds over salads. Or try adding chopped walnuts or almonds to cooked vegetables.   Try some vegetarian meals using beans and peas. Add garbanzo or kidney beans to salads. Make burritos and tacos with mashed pinto beans or black beans.    Where can you learn more?    Go to [email or web address] and sign in to your MyChart account.    Enter [phone] in the Search Health Information box to learn more about "DASH Diet: After Your Visit."    If you do not have an account, please click on the "Sign Up Now" link.     2006-2012 [employer]wise, Inc[employer]. Care instructions adapted under license by [facility]. This care instruction is for use with your licensed healthcare professional. If you have questions about a medical condition or this instruction, always ask your healthcare professional. [employer], Inc[employer] disclaims any warranty or liability for your use of this information.  Content Version: 9.4.94723; Last Revised: November 07, 2010                Elevated Blood Pressure: After Your Visit  Your Care Instructions  Blood pressure is a measure of how hard the blood pushes against the walls of your arteries. It's normal for blood pressure to go up and  down throughout the day. But if it stays up over time, you have high blood pressure.  Two numbers tell you your blood pressure. The first number is the systolic pressure. It shows how hard the blood pushes when your heart is pumping. The second number is the diastolic pressure. It shows how hard the blood pushes between heartbeats, when your heart is relaxed and filling with blood. A normal blood pressure in adults is less than 120/80 (say "120 over 80"). High blood pressure is 140/90 or higher.  The main test for high blood pressure is simple, fast, and painless. To diagnose high blood pressure, your doctor will test your blood pressure at different times. You may have to check your blood pressure at home if there is reason to think that the results in the doctor's office aren't accurate.  If you are diagnosed with high blood pressure, you can work with your doctor to make a long-term plan to manage it.  Follow-up care is a key part of your treatment and safety. Be sure to make and go to all appointments, and call your doctor if you are having problems. It's also a good idea to know your test results and keep a list of the medicines  you take.  How can you care for yourself at home?   Do not smoke. Smoking increases your risk for heart attack and stroke. If you need help quitting, talk to your doctor about stop-smoking programs and medicines. These can increase your chances of quitting for good.   Stay at a healthy weight.   Limit sodium.   Be physically active. Get at least 30 minutes of exercise on most days of the week. Walking is a good choice. You also may want to do other activities, such as running, swimming, cycling, or playing tennis or team sports.   Avoid or limit alcohol. Talk to your doctor about whether you can drink any alcohol.   Eat plenty of fruits, vegetables, and low-fat dairy products. Eat less saturated and total fats.   Learn how to check your blood pressure at home.  When should you  call for help?  Call your doctor now or seek immediate medical care if:   Your blood pressure is much higher than normal (such as 180/110 or higher).   You think high blood pressure is causing symptoms such as:   Severe headache.   Blurry vision.   Nausea or vomiting.  Watch closely for changes in your health, and be sure to contact your doctor if:   You do not get better as expected.    Where can you learn more?    Go to [email or web address] and sign in to your MyChart account.    Enter [phone] in the Search Health Information box to learn more about "Elevated Blood Pressure: After Your Visit."    If you do not have an account, please click on the "Sign Up Now" link.     2006-2012 Healthwise, Inc[employer]. Care instructions adapted under license by [facility]. This care instruction is for use with your licensed healthcare professional. If you have questions about a medical condition or this instruction, always ask your healthcare professional. [employer], Inc[employer] disclaims any warranty or liability for your use of this information.  Content Version: 9.4.94723; Last Revised: December 18, 2010                High Cholesterol: After Your Visit  Your Care Instructions  Cholesterol is a type of fat in your blood. It is needed for many body functions, such as making new cells. Cholesterol is made by your body and also comes from food you eat. High cholesterol means that you have too much of the fat in your blood.  LDL and HDL are part of your total cholesterol. LDL is the "bad" cholesterol that builds up inside the blood vessel walls, making them too narrow. This reduces the flow of blood and can cause a heart attack or stroke. HDL is the "good" cholesterol that helps clear bad cholesterol from the body. You want your good cholesterol to be high and your bad cholesterol to be low. If you do this, you can reduce your chance of having a heart attack or a stroke.  You can improve your  cholesterol levels by eating less animal and trans fat and more vegetables. Getting regular exercise can also help. But for some people, cholesterol problems run in the family. If changes in diet and exercise don't improve your cholesterol levels, talk to your doctor about using medicine.  Follow-up care is a key part of your treatment and safety. Be sure to make and go to all appointments, and call your doctor if you are having problems. It's also a good  idea to know your test results and keep a list of the medicines you take.  How can you care for yourself at home?   Eat a variety of foods every day. Good choices include fruits, vegetables, whole grains (like oatmeal), dried beans and peas, nuts and seeds, soy products (like tofu), and fat-free or low-fat dairy products.   Replace butter, margarine, and hydrogenated or partially hydrogenated oils with olive and canola oils. (Canola oil margarine without trans fat is fine.)   Replace red meat with fish, poultry, and soy protein (like tofu).   Limit processed and packaged foods like chips, crackers, and cookies.   Bake, broil, or steam foods instead of frying them.   Limit foods high in cholesterol, such as egg yolks.   Be physically active. Exercise increases your HDL, or good cholesterol level. Get at least 30 minutes of exercise on most days of the week. Walking is a good choice. You also may want to do other activities, such as running, swimming, cycling, or playing tennis or team sports.   Stay at a healthy weight or lose weight by making the changes in eating and physical activity presented above. Losing just a small amount of weight, even 5 to 10 pounds, can reduce your risk for having a heart attack or stroke.  When should you call for help?  Watch closely for changes in your health, and be sure to contact your doctor if:   You need more help controlling your cholesterol.    Where can you learn more?    Go to [email or web address]  and sign in to your MyChart account.    Enter [phone] in the Search Health Information box to learn more about "High Cholesterol: After Your Visit."    If you do not have an account, please click on the "Sign Up Now" link.     2006-2012 [employer], Inc[employer]. Care instructions adapted under license by [facility]. This care instruction is for use with your licensed healthcare professional. If you have questions about a medical condition or this instruction, always ask your healthcare professional. [employer], Inc[employer] disclaims any warranty or liability for your use of this information.  Content Version: 9.4.94723; Last Revised: November 12, 2010

## 2012-01-23 ENCOUNTER — Encounter

## 2012-01-23 MED ORDER — ROSUVASTATIN CALCIUM 10 MG PO TABS
10 MG | ORAL_TABLET | Freq: Every evening | ORAL | Status: DC
Start: 2012-01-23 — End: 2012-08-02

## 2012-01-28 ENCOUNTER — Encounter

## 2012-01-28 NOTE — Telephone Encounter (Signed)
Per Chip Boer, APRN, left pt msg at his request, that medications he is currently taking would not cause cough.  Advised to f/u with PCP.

## 2012-01-28 NOTE — Telephone Encounter (Signed)
[facility] called today needing a new order for Richard Sims's liver/lipid. The order he had did not have a DX on it. They went ahead and drew blood but needs an order sent today. Thank you.

## 2012-01-28 NOTE — Telephone Encounter (Signed)
Randy c/o dry, hacky, nonproductive cough that is affecting his sleep.  Also reported cough seems worse in the early am when air is damp and thick, as opposed to dry, hot air.  Also c/o mid chest feeling tight.  During conversation, noted nasally sounding voice as though he might have congestion.  He did report he had recently taken OTC antihistamine (nondecongestant) but has not helped.  He is wondering about the Lopressor 12.5 mg BID causing cough?

## 2012-01-28 NOTE — Telephone Encounter (Signed)
[name] called today wanting to speak to [facility] about a persistent cough he has. He says he is not sleeping well and does have some chest pressure. He is concerned these symptoms is caused by a medication that [facility] prescribed for him. Please call. Thank you.

## 2012-02-16 ENCOUNTER — Encounter

## 2012-02-16 MED ORDER — METOPROLOL TARTRATE 25 MG PO TABS
25 MG | ORAL_TABLET | ORAL | Status: DC
Start: 2012-02-16 — End: 2013-03-02

## 2012-03-24 NOTE — Telephone Encounter (Signed)
Richard Sims is scheduled for R total knee on 07/12/12 and needs to know if he needs to be seen before that to stop his plavix. Thank you

## 2012-04-08 NOTE — Telephone Encounter (Signed)
Has appointment 9/13

## 2012-04-08 NOTE — Telephone Encounter (Signed)
Message left for leigh at dr. Jane Canary office appointment 9/13

## 2012-05-05 MED ORDER — NITROGLYCERIN 0.4 MG SL SUBL
0.4 MG | ORAL_TABLET | SUBLINGUAL | Status: DC | PRN
Start: 2012-05-05 — End: 2012-11-01

## 2012-05-05 NOTE — Patient Instructions (Addendum)
Continue current medications as prescribed.   Stay on Plavix for one year after stent placement (12/11/12).   Continue heart healthy diet.    Exercise as tolerated.  Strive for 15 minutes of exercise most days of the week.   Blood pressure goal is 140/90 or less.  If you are a diabetic, the goal is 130/80 or less.   Continue to follow up with primary care provider for medical concerns.   Call with any problems, questions or concerns.    Follow up as scheduled with Dr. Sandi Mariscal.    Additional patient instructions:  You have been given orders for a cholesterol check.  Have lab done in December.  You will need to fast after midnight.  You may have black coffee, diet soda or water before the test.  You may take your medications before the test.    A Healthy Heart: After Your Visit  Your Care Instructions  Heart disease occurs when the vessels that supply oxygen-rich blood to your heart become narrow or blocked. A heart attack happens when blood flow is completely blocked. A high-fat diet, smoking, and other factors increase the risk of heart disease.  Your doctor has found that you have a chance of having heart disease. You can do lots of things to keep your heart healthy. It may not be easy, but you can change your diet, exercise more, and quit smoking. These steps really work to lower your chance of heart disease.  Follow-up care is a key part of your treatment and safety. Be sure to make and go to all appointments, and call your doctor if you are having problems. It's also a good idea to know your test results and keep a list of the medicines you take.  How can you care for yourself at home?  Diet   Use less salt when you cook and eat. This helps lower your blood pressure. Taste food before salting. Add only a little salt when you think you need it. With time, your taste buds will adjust to less salt.   Eat fewer snack items, fast foods, canned soups, and other high-salt, high-fat, processed foods.   Read food  labels and try to avoid saturated and trans fats. They increase your risk of heart disease by raising cholesterol levels.   Limit the amount of solid fat--butter, margarine, and shortening--you eat. Use olive, peanut, or canola oil when you cook. Bake, broil, and steam foods instead of frying them.   Eating fish can lower your risk for heart disease. Eat at least 2 servings of fish a week. Salmon, mackerel, herring, sardines, and chunk light tuna are very good choices. These fish contain omega-3 fatty acids.   Eat a variety of fruit and vegetables every day. Dark green, deep orange, red, or yellow fruits and vegetables are especially good for you. Examples include spinach, carrots, peaches, and berries.   Foods high in fiber can reduce your cholesterol and provide important vitamins and minerals. High-fiber foods include whole-grain cereals and breads, oatmeal, beans, brown rice, citrus fruits, and apples.   Limit drinks and foods with added sugar. These include candy, desserts, and soda pop.  Lifestyle changes   If your doctor recommends it, get more exercise. Walking is a good choice. Bit by bit, increase the amount you walk every day. Try for at least 30 minutes on most days of the week. You also may want to swim, bike, or do other activities.   Do not smoke. If you  need help quitting, talk to your doctor about stop-smoking programs and medicines. These can increase your chances of quitting for good. Quitting smoking may be the most important step you can take to protect your heart. It is never too late to quit. You will get health benefits right away.   Limit alcohol to 2 drinks a day for men and 1 drink a day for women. Too much alcohol can cause health problems.  Medicines   Take your medicines exactly as prescribed. Call your doctor if you think you are having a problem with your medicine.   If your doctor recommends aspirin, take the amount directed each day. Make sure you take aspirin and not  another kind of pain reliever, such as acetaminophen (Tylenol). If you take ibuprofen (such as Advil or Motrin) for other problems, take aspirin at least 2 hours before taking ibuprofen.  When should you call for help?  Call 911 if you have symptoms of a heart attack. These may include:   You have chest pain or pressure. This may occur with:   Chest pain or pressure, or a strange feeling in the chest.   Sweating.   Shortness of breath.   Pain, pressure, or a strange feeling in the back, neck, jaw, or upper belly or in one or both shoulders or arms.   Lightheadedness or sudden weakness.   A fast or irregular heartbeat.  After you call 911, the operator may tell you to chew 1 adult-strength or 2 to 4 low-dose aspirin. Wait for an ambulance. Do not try to drive yourself.  Watch closely for changes in your health, and be sure to contact your doctor if:   Your symptoms are slowly getting worse.   You do not get better as expected.    Where can you learn more?    Go to [email or web address] and sign in to your MyChart account. Enter [id number] in the Search Health Information box to learn more about "A Healthy Heart: After Your Visit."    If you do not have an account, please click on the "Sign Up Now" link.       2006-2013 [employer], Inc[employer]. Care instructions adapted under license by [facility]. This care instruction is for use with your licensed healthcare professional. If you have questions about a medical condition or this instruction, always ask your healthcare professional. Healthwise, Incorporated disclaims any warranty or liability for your use of this information.  Content Version: 9.7.130178; Last Revised: December 14, 2009

## 2012-05-07 NOTE — Progress Notes (Signed)
[facility], [place]  [address], [place]  [address]  Phone: [phone]  Fax: [phone]    OFFICE VISIT:  05/05/2012    Richard Sims - DOB: Nov 20, 1950    Reason For Visit:  Richard Sims is a 61 y.o. male who is here for 6 Month Follow-Up and Coronary Artery Disease    Overall, the patient is doing well without cardiac related symptoms.  The patient 's last cath was on 12/12/11 showing normal EF and stent placement to proximal LAD.    Subjective  Richard Sims denies exertional chest pain, shortness of breath, orthopnea, paroxysmal nocturnal dyspnea, syncope, presyncope, sustained arrythmia, edema and fatigue.  The patient denies numbness or weakness to suggest cerebrovascular accident or transient ischemic attack.      Richard Sims has the following history as recorded in EpicCare:    Patient Active Problem List    Diagnosis Date Noted   . Chest pain 12/09/2011   . Shortness of breath 12/09/2011   . S/P CABG x 1    . CAD (coronary artery disease)    . Hyperlipidemia    . Hypertension      Past Medical History   Diagnosis Date   . GERD (gastroesophageal reflux disease)    . Cervical disc disease    . CAD (coronary artery disease)    . Hyperlipidemia    . Hypertension    . Nephrolithiasis    . S/P CABG x 1      MICS with LIMA to LAD via left mini thoracotomy     Past Surgical History   Procedure Laterality Date   . Cardiac catherization  05/29/11   [facility]     EF  over 60%   . Coronary artery bypass graft  06/02/2011      HYBRID MICS CABG, LT THORACOTOMY APPROACH, LIMA-LAD, PCI DES-RCA   . Joint replacement       LEFT KNEE   . Spine surgery       ACF X 2   . Knee arthroscopy       BILATERAL   . Cardiac catherization  12/12/2011  [facility]     with PTCA, stent to LAD     Family History   Problem Relation Age of Onset   . High Blood Pressure Mother    . Heart Disease Father    . High Blood Pressure Father    . High Cholesterol Father    . Depression Paternal Grandmother      History    Substance Use Topics   . Smoking status: Never Smoker    . Smokeless tobacco: Never Used   . Alcohol Use: Yes      Occassional      Current Outpatient Prescriptions   Medication Sig Dispense Refill   . esomeprazole Magnesium (NEXIUM) 40 MG PACK Take 40 mg by mouth daily.       . nitroGLYCERIN (NITROSTAT) 0.4 MG SL tablet Place 1 tablet under the tongue every 5 minutes as needed for Chest pain.  25 tablet  3   . nitroGLYCERIN (NITROSTAT) 0.4 MG SL tablet Place 1 tablet under the tongue every 5 minutes as needed for Chest pain.  25 tablet  3   . metoprolol (LOPRESSOR) 25 MG tablet Take one-half tablet by mouth twice daily  90 tablet  3   . rosuvastatin (CRESTOR) 10 MG tablet Take 1 tablet by mouth nightly.  90 tablet  1   .  celecoxib (CELEBREX) 200 MG capsule Take 200 mg by mouth as needed.       . clopidogrel (PLAVIX) 75 MG tablet Take 1 tablet by mouth daily.  90 tablet  3   . aspirin 81 MG EC tablet Take 81 mg by mouth daily.         Marland Kitchen EXPIRED: Polysaccharide Iron (FERREX) 150 MG CAPS Take 150 mg by mouth daily.         Marland Kitchen EXPIRED: ketorolac (TORADOL) 10 MG tablet Take 10 mg by mouth 3 times daily. FOR 3 DAYS          No current facility-administered medications for this visit.     Allergies: Simvastatin    Review of Systems  Constitutional - no significant activity change, appetite change, or unexpected weight change. No fever, chills or diaphoresis.  No fatigue.   HEENT - no significant rhinorrhea or epistaxis. No tinnitus or significant hearing loss.   Eyes - no sudden vision change or amaurosis. No corneal arcus, xantholasma, subconjunctival hemorrhage or discharge.  Respiratory - no significant wheezing, stridor, apnea or cough.  No dyspnea on exertion or shortness of breath.  Cardiovascular - no exertional chest pain, orthopnea or PND.  No sensation of sustained arrythmia or slow heart rate.   No claudication or leg edema.  Gastrointestinal - no abdominal swelling or pain. No blood in stool. No severe  constipation, diarrhea, nausea, or vomiting.   Genitourinary - no dysuria, frequency, or urgency. No flank pain or hematuria.   Musculoskeletal - no back pain, gait disturbance, or myalgia.    Extremities - no clubbing, cyanosis or edema.  Skin - no color change or rash.  No pallor.  No new surgical incision.  Neurologic - no speech difficulty, facial asymmetry or lateralizing weakness.  No seizures, presyncope, syncope, or significant dizziness.  Hematologic - no easy bruising or excessive bleeding.   Psychiatric - no severe anxiety or insomnia.  No confusion.   All other review of systems are negative.      Objective  Vital Signs - BP 116/70  Pulse 78  Ht 5\' 7"  (1.702 m)  Wt 188 lb (85.276 kg)  BMI 29.44 kg/m2  General - Richard Sims is alert, cooperative, and pleasant.  Well groomed.  No acute distress.    Body habitus - Body mass index is 29.44 kg/(m^2).  HEENT - Head is normocephalic. No circumoral cyanosis.  Dentition is normal.  EYES -   Lids normal without ptosis.  No discharge, edema or subconjunctival hemorrhage.   Neck - Symmetrical without apparent mass or lymphadenopathy.   Respiratory - Normal respiratory effort without use of accessory muscles.  Ausculatation reveals vesicular breath sounds without crackles, wheezes, rub or rhonchi.    Cardiovascular - No jugular venous distention.  Auscultation reveals regular rate and rhythm.  No audible clicks, gallop or rub.  No murmur.  No lower extremity varicosities.  No carotid bruits.   Abdominal -  No visible distention, mass or pulsations.  Extremities - No clubbing or cyanosis.  No statis dermatitis or ulcers. No edema.    Musculoskeletal - Gait is even and regular without limp or shuffle.  No Osler's nodes.  No kyphosis or scoliosis.  Ambulates without assistance.  Skin -  Warm and dry; no rash or pallor.   No new surgical wound.  Neurological - No focal neurological deficits.  Thought processes coherent.  No apparent tremor.   Oriented to person, place and  time.    Psychiatric -  Appropriate affect and mood.     Assessment:    Stable cardiovascular status. No evidence of overt heart failure, angina or dysrhythmia  1. CAD (coronary artery disease)  nitroGLYCERIN (NITROSTAT) 0.4 MG SL tablet, nitroGLYCERIN (NITROSTAT) 0.4 MG SL tablet   2. Hypertension  nitroGLYCERIN (NITROSTAT) 0.4 MG SL tablet   3. Hyperlipidemia  nitroGLYCERIN (NITROSTAT) 0.4 MG SL tablet, Lipid panel, AST, ALT     Plan   Continue current medications as prescribed.   Stay on Plavix for one year after stent placement (12/11/12).   Continue heart healthy diet.    Exercise as tolerated.  Strive for 15 minutes of exercise most days of the week.   Blood pressure goal is 140/90 or less.  If you are a diabetic, the goal is 130/80 or less.   Continue to follow up with primary care provider for medical concerns.   Call with any problems, questions or concerns.    Follow up as scheduled with Dr. Sandi Mariscal.    Additional patient instructions:  You have been given orders for a cholesterol check.  Have lab done in December.  You will need to fast after midnight.  You may have black coffee, diet soda or water before the test.  You may take your medications before the test.    A Healthy Heart: After Your Visit  Your Care Instructions  Heart disease occurs when the vessels that supply oxygen-rich blood to your heart become narrow or blocked. A heart attack happens when blood flow is completely blocked. A high-fat diet, smoking, and other factors increase the risk of heart disease.  Your doctor has found that you have a chance of having heart disease. You can do lots of things to keep your heart healthy. It may not be easy, but you can change your diet, exercise more, and quit smoking. These steps really work to lower your chance of heart disease.  Follow-up care is a key part of your treatment and safety. Be sure to make and go to all appointments, and call your doctor if you are having problems. It's also a good  idea to know your test results and keep a list of the medicines you take.  How can you care for yourself at home?  Diet   Use less salt when you cook and eat. This helps lower your blood pressure. Taste food before salting. Add only a little salt when you think you need it. With time, your taste buds will adjust to less salt.   Eat fewer snack items, fast foods, canned soups, and other high-salt, high-fat, processed foods.   Read food labels and try to avoid saturated and trans fats. They increase your risk of heart disease by raising cholesterol levels.   Limit the amount of solid fat--butter, margarine, and shortening--you eat. Use olive, peanut, or canola oil when you cook. Bake, broil, and steam foods instead of frying them.   Eating fish can lower your risk for heart disease. Eat at least 2 servings of fish a week. Salmon, mackerel, herring, sardines, and chunk light tuna are very good choices. These fish contain omega-3 fatty acids.   Eat a variety of fruit and vegetables every day. Dark green, deep orange, red, or yellow fruits and vegetables are especially good for you. Examples include spinach, carrots, peaches, and berries.   Foods high in fiber can reduce your cholesterol and provide important vitamins and minerals. High-fiber foods include whole-grain cereals and breads, oatmeal, beans, brown rice, citrus fruits,  and apples.   Limit drinks and foods with added sugar. These include candy, desserts, and soda pop.  Lifestyle changes   If your doctor recommends it, get more exercise. Walking is a good choice. Bit by bit, increase the amount you walk every day. Try for at least 30 minutes on most days of the week. You also may want to swim, bike, or do other activities.   Do not smoke. If you need help quitting, talk to your doctor about stop-smoking programs and medicines. These can increase your chances of quitting for good. Quitting smoking may be the most important step you can take to protect  your heart. It is never too late to quit. You will get health benefits right away.   Limit alcohol to 2 drinks a day for men and 1 drink a day for women. Too much alcohol can cause health problems.  Medicines   Take your medicines exactly as prescribed. Call your doctor if you think you are having a problem with your medicine.   If your doctor recommends aspirin, take the amount directed each day. Make sure you take aspirin and not another kind of pain reliever, such as acetaminophen (Tylenol). If you take ibuprofen (such as Advil or Motrin) for other problems, take aspirin at least 2 hours before taking ibuprofen.  When should you call for help?  Call 911 if you have symptoms of a heart attack. These may include:   You have chest pain or pressure. This may occur with:   Chest pain or pressure, or a strange feeling in the chest.   Sweating.   Shortness of breath.   Pain, pressure, or a strange feeling in the back, neck, jaw, or upper belly or in one or both shoulders or arms.   Lightheadedness or sudden weakness.   A fast or irregular heartbeat.  After you call 911, the operator may tell you to chew 1 adult-strength or 2 to 4 low-dose aspirin. Wait for an ambulance. Do not try to drive yourself.  Watch closely for changes in your health, and be sure to contact your doctor if:   Your symptoms are slowly getting worse.   You do not get better as expected.    Where can you learn more?    Go to [email or web address] and sign in to your MyChart account. Enter [phone] in the Search Health Information box to learn more about "A Healthy Heart: After Your Visit."    If you do not have an account, please click on the "Sign Up Now" link.       2006-2013 Healthwise, Inc[employer]. Care instructions adapted under license by [facility]. This care instruction is for use with your licensed healthcare professional. If you have questions about a medical condition or this instruction, always  ask your healthcare professional. [employer], [employer] disclaims any warranty or liability for your use of this information.  Content Version: 9.7.130178; Last Revised: December 14, 2009    Aloha Gell, APRN

## 2012-08-02 ENCOUNTER — Encounter

## 2012-08-02 MED ORDER — CLOPIDOGREL BISULFATE 75 MG PO TABS
75 MG | ORAL_TABLET | Freq: Every day | ORAL | Status: DC
Start: 2012-08-02 — End: 2013-07-01

## 2012-08-02 MED ORDER — ROSUVASTATIN CALCIUM 10 MG PO TABS
10 MG | ORAL_TABLET | Freq: Every evening | ORAL | Status: DC
Start: 2012-08-02 — End: 2012-12-16

## 2012-08-02 NOTE — Telephone Encounter (Signed)
Patient having labs drawn this week. I explained no results then we cant fill cholesterol med till result come in.

## 2012-08-13 NOTE — Telephone Encounter (Signed)
ZO:XWRUE and liver panel looks good. Continue same and recheck labs in 6 months. Aloha Gell, APRN    Called and Pt aware of results.

## 2012-11-01 MED ORDER — NITROGLYCERIN 0.4 MG SL SUBL
0.4 MG | ORAL_TABLET | SUBLINGUAL | Status: DC | PRN
Start: 2012-11-01 — End: 2013-05-19

## 2012-11-01 NOTE — Progress Notes (Signed)
Patient can stop Plavix on 12/07/2012 for therapy completion per Dr. Sandi Mariscal.

## 2012-11-01 NOTE — Patient Instructions (Addendum)
   Continue your current medications.   You can go ahead and STOP plavix on 12/07/2012.   Please bring in medication bottles on your next appointment.   Return for wellness visit in six months and your cardiologist will see you in a year.     If you should have any cardiac questions or concerns prior to your next appointment, please call our office.    We are committed to providing you with the best care possible.   In order to help [place] achieve these goals please remember to bring all medications, herbal products, and over the counter supplements with you to each visit.     If your provider has ordered testing for you, please be sure to follow up with our office if you have not received results within 7 days after the testing took place.     *If you receive a survey after visiting one of our offices, please take time to share your experience concerning your physician office visit. These surveys are confidential and no health information about you is shared. We are eager to improve for you and we are counting on your feedback to help make that happen.

## 2012-11-02 NOTE — Progress Notes (Signed)
[facility] [place], [place].  [facility]  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  He has no complaints.        Richard Sims has the following history as recorded in EpicCare:  Patient Active Problem List   Diagnosis Code   . CAD (coronary artery disease) 414.00   . Hyperlipidemia 272.4   . Hypertension 401.9   . S/P CABG x 1 V45.81   . Chest pain 786.50   . Shortness of breath 786.05     Past Medical History   Diagnosis Date   . GERD (gastroesophageal reflux disease)    . Cervical disc disease    . CAD (coronary artery disease)    . Hyperlipidemia      Cardiology manages    . Hypertension    . Nephrolithiasis    . S/P CABG x 1      MICS with LIMA to LAD via left mini thoracotomy     Past Surgical History   Procedure Laterality Date   . Cardiac catherization  05/29/11   [facility]     EF  over 60%   . Coronary artery bypass graft  06/02/2011      HYBRID MICS CABG, LT THORACOTOMY APPROACH, LIMA-LAD, PCI DES-RCA   . Joint replacement       LEFT KNEE   . Spine surgery       ACF X 2   . Knee arthroscopy       BILATERAL   . Cardiac catherization  12/12/2011  [facility]     with PTCA, stent to LAD     Family History   Problem Relation Age of Onset   . High Blood Pressure Mother    . Heart Disease Father    . High Blood Pressure Father    . High Cholesterol Father    . Depression Paternal Grandmother      History   Substance Use Topics   . Smoking status: Never Smoker    . Smokeless tobacco: Never Used   . Alcohol Use: Yes      Comment: Occassional     Allergies:  Simvastatin    Outpatient Prescriptions Marked as Taking for the 11/01/12 encounter (Office Visit) with Richard Sims Richard Barre, MD   Medication Sig Dispense Refill   . nitroGLYCERIN (NITROSTAT) 0.4 MG SL tablet Place 1 tablet under  the tongue every 5 minutes as needed for Chest pain.  25 tablet  3   . rosuvastatin (CRESTOR) 10 MG tablet Take 1 tablet by mouth nightly.  90 tablet  0   . clopidogrel (PLAVIX) 75 MG tablet Take 1 tablet by mouth daily.  90 tablet  3   . esomeprazole Magnesium (NEXIUM) 40 MG PACK Take 40 mg by mouth daily.       . metoprolol (LOPRESSOR) 25 MG tablet Take one-half tablet by mouth twice daily  90 tablet  3   . celecoxib (CELEBREX) 200 MG capsule Take 200 mg by mouth as needed.       Marland Kitchen aspirin 81 MG EC tablet Take 81 mg by mouth daily.             Objective:   BP 140/82  Pulse 80  Ht 5\' 7"  (1.702 m)  Wt 201 lb (91.173 kg)  BMI 31.47 kg/m2    GENERAL:  The patient is a pleasant 62 y.o. male in no apparent distress.   HEENT:  Unremarkable.    NECK:  Supple, no bruits, no jugular venous pressures.      LUNGS:  The lungs are clear.    HEART:  Heart rhythm is regular.  No murmurs or gallops.   ABDOMEN:  Abdomen is soft without masses or tenderness.       EXTREMITIES:  No edema.  Peripheral pulses intact.  MUSCULOSKELETAL:  No musculoskeletal symptoms.     NEUROLOGICAL:  Cranial nerves II through XII are grossly intact.       Assessment:   1.  Stable cardiac status    Patient was examined for:  1. CAD (coronary artery disease)  nitroGLYCERIN (NITROSTAT) 0.4 MG SL tablet   2. Hypertension     3. Hyperlipidemia       Plan:   1.  He may discontinue his Plavix in mid April.  Continue other medications.   2.  Wellness visit in six months.  Return to see me in a year.  He was instructed to call should he have any concerns or problems prior to his next appointment.    Richard Sims, M.D., Richard Sims.Richard Sims.  [facility] [place], [place].    Cc:   Richard Sims, M.D.  [address] [place] [address]  [phone] phone  [phone] fax

## 2012-12-16 ENCOUNTER — Encounter

## 2012-12-16 MED ORDER — ROSUVASTATIN CALCIUM 10 MG PO TABS
10 MG | ORAL_TABLET | Freq: Every evening | ORAL | Status: DC
Start: 2012-12-16 — End: 2012-12-20

## 2012-12-20 ENCOUNTER — Encounter

## 2012-12-20 MED ORDER — ROSUVASTATIN CALCIUM 10 MG PO TABS
10 MG | ORAL_TABLET | Freq: Every evening | ORAL | Status: DC
Start: 2012-12-20 — End: 2013-05-09

## 2013-03-02 ENCOUNTER — Encounter

## 2013-03-02 MED ORDER — METOPROLOL TARTRATE 25 MG PO TABS
25 MG | ORAL_TABLET | ORAL | Status: DC
Start: 2013-03-02 — End: 2014-01-10

## 2013-05-09 ENCOUNTER — Encounter

## 2013-05-09 MED ORDER — ROSUVASTATIN CALCIUM 10 MG PO TABS
10 MG | ORAL_TABLET | Freq: Every evening | ORAL | Status: DC
Start: 2013-05-09 — End: 2013-06-04

## 2013-05-18 NOTE — Progress Notes (Signed)
[facility] [place] [place]  [address], [place]  [address]  Phone: [phone]  Fax: [phone]    OFFICE VISIT:  05/18/2013    Richard Sims - DOB: Jan 31, 1951    Reason For Visit:  Richard Sims is a 62 y.o. male who is here for 6 Month Follow-Up; Coronary Artery Disease; and Hyperlipidemia  Pt states he is here for a 6 month follow up. Dr. Sandi Mariscal manages lipids. Pt states he had lipids done in 07/14 at [facility]. Lipid panel requested. ALT and AST ordered. No refills requested.    The patient states he has recently been fly fishing and had a great time. He states he fishes daily. The patient denies any  cardiac complaints, stroke like symptoms, syncope, or near syncope.     Subjective  The patient's PCP monitors lab for cholesterol.    Richard Sims has the following history as recorded in EpicCare:    Patient Active Problem List    Diagnosis Date Noted   . Chest pain 12/09/2011   . Shortness of breath 12/09/2011   . S/P CABG x 1    . CAD (coronary artery disease)    . Hyperlipidemia    . Hypertension      Past Medical History   Diagnosis Date   . GERD (gastroesophageal reflux disease)    . Cervical disc disease    . CAD (coronary artery disease)    . Hyperlipidemia      Cardiology manages    . Hypertension    . Nephrolithiasis    . S/P CABG x 1      MICS with LIMA to LAD via left mini thoracotomy     Past Surgical History   Procedure Laterality Date   . Cardiac catheterization  05/29/11   [facility]     EF  over 60%   . Coronary artery bypass graft  06/02/2011      HYBRID MICS CABG, LT THORACOTOMY APPROACH, LIMA-LAD, PCI DES-RCA   . Joint replacement       LEFT KNEE   . Spine surgery       ACF X 2   . Knee arthroscopy       BILATERAL   . Cardiac catheterization  12/12/2011  [facility]     with PTCA, stent to LAD     Family History   Problem Relation Age of Onset   . High Blood Pressure Mother    . Heart Disease Father    . High Blood Pressure Father    . High Cholesterol Father    .  Depression Paternal Grandmother      History   Substance Use Topics   . Smoking status: Never Smoker    . Smokeless tobacco: Never Used   . Alcohol Use: Yes      Comment: Occassional      Current Outpatient Prescriptions   Medication Sig Dispense Refill   . rosuvastatin (CRESTOR) 10 MG tablet Take 1 tablet by mouth nightly.  90 tablet  0   . metoprolol (LOPRESSOR) 25 MG tablet Take one-half tablet by mouth twice daily  90 tablet  3   . nitroGLYCERIN (NITROSTAT) 0.4 MG SL tablet Place 1 tablet under the tongue every 5 minutes as needed for Chest pain.  25 tablet  3   . clopidogrel (PLAVIX) 75 MG tablet Take 1 tablet by mouth daily.  90 tablet  3   . esomeprazole Magnesium (NEXIUM) 40  MG PACK Take 40 mg by mouth daily.       . celecoxib (CELEBREX) 200 MG capsule Take 200 mg by mouth as needed.       Marland Kitchen aspirin 81 MG EC tablet Take 81 mg by mouth daily.         . Polysaccharide Iron (FERREX) 150 MG CAPS Take 150 mg by mouth daily.         Marland Kitchen ketorolac (TORADOL) 10 MG tablet Take 10 mg by mouth 3 times daily. FOR 3 DAYS          No current facility-administered medications for this visit.     Allergies: Simvastatin    Review of Systems  Constitutional - no significant activity change, appetite change, or unexpected weight change. No fever, chills or diaphoresis.  No fatigue.   HEENT - no significant rhinorrhea or epistaxis. No tinnitus or significant hearing loss.   Eyes - no sudden vision change or amaurosis. No corneal arcus, xantholasma, subconjunctival hemorrhage or discharge.  Respiratory - no significant wheezing, stridor, apnea or cough.  No dyspnea on exertion or shortness of breath.  Cardiovascular - no exertional chest pain, orthopnea or PND.  No sensation of sustained arrythmia or slow heart rate.   No claudication or leg edema.  Gastrointestinal - no abdominal swelling or pain. No blood in stool. No severe constipation, diarrhea, nausea, or vomiting.   Genitourinary - no dysuria, frequency, or urgency. No  flank pain or hematuria.   Musculoskeletal - no back pain, gait disturbance, or myalgia.    Extremities - no clubbing, cyanosis or edema.  Skin - no color change or rash.  No pallor.  No new surgical incision.  Neurologic - no speech difficulty, facial asymmetry or lateralizing weakness.  No seizures, presyncope, syncope, or significant dizziness.  Hematologic - no easy bruising or excessive bleeding.   Psychiatric - no severe anxiety or insomnia. Normal affect.  No confusion. The patient denies any depression or suicidal ideation at this time.  All other review of systems are negative.      Objective  Vital Signs - BP 106/82  Pulse 88  Ht 5\' 7"  (1.702 m)  Wt 185 lb (83.915 kg)  BMI 28.97 kg/m2  General - Richard Sims is alert, cooperative, and pleasant.  Well groomed.  No acute distress.    Body habitus - Body mass index is 28.97 kg/(m^2).  HEENT - Head is normocephalic. No circumoral cyanosis.  Dentition is normal.  EYES -   Lids normal without ptosis.  No discharge, edema or subconjunctival hemorrhage.   Neck - Symmetrical without apparent mass or lymphadenopathy.   Respiratory - Normal respiratory effort without use of accessory muscles.  Ausculatation reveals vesicular breath sounds without crackles, wheezes, rub or rhonchi.    Cardiovascular - No jugular venous distention.  Auscultation reveals regular rate and rhythm.  No audible clicks, gallop or rub.  No murmur.  No lower extremity varicosities.  No carotid bruits.  Peripheral pulses:   Abdominal -  No visible distention, mass or pulsations.  Extremities - No clubbing or cyanosis.  No statis dermatitis or ulcers. Noedema.    Musculoskeletal - Gait is even and regular without limp or shuffle.  No Osler's nodes.  No kyphosis or scoliosis.  Ambulates without assistance.  Skin -  Warm and dry; no rash or pallor.   No new surgical wound.  Neurological - No focal neurological deficits.  Thought processes coherent.  No apparent tremor.   Oriented to person,  place  and time.    Psychiatric -  Appropriate affect and mood.     Assessment:     Stable cardiovascular status. No evidence of overt heart failure, angina or dysrhythmia  1. Hyperlipidemia  ALT    AST   2. CAD (coronary artery disease)     3. Hypertension         There are no Patient Instructions on file for this visit.     Plan  Continue current medications as prescribed.   Continue heart healthy diet.   Exercise as tolerated.  Strive for 15 minutes of exercise most days of the week.    Blood pressure goal is 140/90 or less. If you are a diabetic, the goal is 130/80 or less.   Continue to follow up with primary care provider for medical concerns.   Call with any problems, questions or concerns.   Follow up as scheduled.  Educational material provided.    CAD-Cardiac protection and prevention along with medication/education given  Hyperlipidemia-reviewed labs patient will need an AST,ALT as it was not drawn  HTN-stable CPT      Additional instructions: As always patient advised to bring in medication bottles in order to correctly reconcile with our current list.    For any patient on anticoagulation or antiplatelets , they are advised to given no less that 10 days notice prior surgical procedure in order withhold medication(s)    Chip Boer, APRN

## 2013-05-18 NOTE — Patient Instructions (Signed)
Learning About Coronary Artery Disease (CAD)  What is coronary artery disease?     Coronary artery disease (CAD) occurs when the arteries that bring oxygen-rich blood to your heart become narrow or blocked. This usually happens when plaque builds up in them. Plaque is a fatty substance made of cholesterol, calcium, and other substances in the blood.  What happens when you have coronary artery disease?   Narrowed arteries cause poor blood flow. This can lead to chest pain or discomfort. If blood flow is completely blocked, you could have a heart attack.   You can slow CAD and reduce the risk of future problems by making changes in your lifestyle. These include quitting smoking and eating heart-healthy foods.   Treatments for CAD, along with changes in your lifestyle, can help you live a longer and healthier life.  What are the symptoms?  The most common symptoms of CAD are:   Chest pain or discomfort. This is also called angina. These feelings may start in or spread to the left shoulder, neck, or lower jaw.   Shortness of breath when you exercise or do other vigorous activity.  Others symptoms include:   A fast heartbeat.   Weakness, dizziness, and feeling sick to your stomach (nausea).   Increased sweating.  How can you prevent coronary artery disease?   Do not smoke. It may be the best thing you can do to prevent heart disease. If you need help quitting, talk to your doctor about stop-smoking programs and medicines. These can increase your chances of quitting for good.   Be active. Get at least 30 minutes of exercise on most days of the week. Walking is a good choice. You also may want to do other activities, such as running, swimming, cycling, or playing tennis or team sports.   Eat heart-healthy foods. Eat more fruits and vegetables and less foods that contain saturated and trans fats. Limit alcohol, sodium, and sweets.   Stay at a healthy weight. Lose weight if you need to.   Manage other health  problems such as diabetes, high blood pressure, and high cholesterol.   Talk to your doctor about taking a daily aspirin.   Manage stress. Stress can hurt your heart. To keep stress low, talk about your problems and feelings. Don't keep your feelings hidden.  How is coronary artery disease treated?   Your doctor will suggest that you make lifestyle changes. For example, your doctor may ask you to eat healthy foods, quit smoking, lose extra weight, and be more active.   You will have to take medicines.   Your doctor may suggest procedures such as opening blocked arteries (angioplasty) or using healthy blood vessels to create detours around narrowed or blocked arteries (bypass surgery).  Follow-up care is a key part of your treatment and safety. Be sure to make and go to all appointments, and call your doctor if you are having problems. It's also a good idea to know your test results and keep a list of the medicines you take.   Where can you learn more?   Go to [email or web address] and sign in to your MyChart account. Enter C643 in the Search Health Information box to learn more about "Learning About Coronary Artery Disease (CAD)."    If you do not have an account, please click on the "Sign Up Now" link.      2006-2014 Healthwise, Incorporated. Care instructions adapted under license by [facility]. This care instruction is for use with your  licensed Designer, fashion/clothing. If you have questions about a medical condition or this instruction, always ask your healthcare professional. Healthwise, Incorporated disclaims any warranty or liability for your use of this information.  Content Version: 10.1.311062; Current as of: November 03, 2012              High Cholesterol: After Your Visit  Your Care Instructions  Cholesterol is a type of fat in your blood. It is needed for many body functions, such as making new cells. Cholesterol is made by your body. It also comes from food you eat.  High cholesterol means that you have too much of the fat in your blood. This raises your risk of a heart attack and stroke.  LDL and HDL are part of your total cholesterol. LDL is the "bad" cholesterol. High LDL can raise your risk for heart disease, heart attack, and stroke. HDL is the "good" cholesterol. It helps clear bad cholesterol from the body. High HDL is linked with a lower risk of heart disease, heart attack, and stroke.  Your cholesterol levels help your doctor find out your risk for having a heart attack or stroke. You and your doctor can talk about whether you need to lower your risk and what treatment is best for you.  A heart-healthy lifestyle along with medicines can help lower your cholesterol and your risk. The way you choose to lower your risk will depend on how high your risk is for heart attack and stroke. It will also depend on how you feel about taking medicines.  Follow-up care is a key part of your treatment and safety. Be sure to make and go to all appointments, and call your doctor if you are having problems. It's also a good idea to know your test results and keep a list of the medicines you take.  How can you care for yourself at home?   Eat a variety of foods every day. Good choices include fruits, vegetables, whole grains (like oatmeal), dried beans and peas, nuts and seeds, soy products (like tofu), and fat-free or low-fat dairy products.   Replace butter, margarine, and hydrogenated or partially hydrogenated oils with olive and canola oils. (Canola oil margarine without trans fat is fine.)   Replace red meat with fish, poultry, and soy protein (like tofu).   Limit processed and packaged foods like chips, crackers, and cookies.   Bake, broil, or steam foods. Don't fry them.   Limit foods high in cholesterol. These include egg yolks.   Be physically active. Get at least 30 minutes of exercise on most days of the week. Walking is a good choice. You also may want to do other  activities, such as running, swimming, cycling, or playing tennis or team sports.   Stay at a healthy weight or lose weight by making the changes in eating and physical activity listed above. Losing just a small amount of weight, even 5 to 10 pounds, can reduce your risk for having a heart attack or stroke.   Do not smoke.  When should you call for help?  Watch closely for changes in your health, and be sure to contact your doctor if:   You need help making lifestyle changes.   You have questions about your medicine.   Where can you learn more?   Go to [email or web address] and sign in to your MyChart account. Enter [phone] in the Search Health Information box to learn more about "High Cholesterol: After Your Visit."    If  you do not have an account, please click on the "Sign Up Now" link.      2006-2014 Healthwise, Incorporated. Care instructions adapted under license by [facility]. This care instruction is for use with your licensed healthcare professional. If you have questions about a medical condition or this instruction, always ask your healthcare professional. Healthwise, Incorporated disclaims any warranty or liability for your use of this information.  Content Version: 10.1.311062; Current as of: November 03, 2012              High Blood Pressure: After Your Visit  Your Care Instructions  If your blood pressure is usually above 140/90, you have high blood pressure, or hypertension. Despite what a lot of people think, high blood pressure usually doesn't cause headaches or make you feel dizzy or lightheaded. It usually has no symptoms. But it does increase your risk for heart attack, stroke, and kidney or eye damage. The higher your blood pressure, the more your risk increases.  Your doctor will give you a goal for your blood pressure. Your goal will be based on your health and your age. An example of a goal is to keep your blood pressure below 140/90.  Lifestyle changes, such  as eating healthy and being active, are always important to help lower blood pressure. You might also take medicine to reach your blood pressure goal.  Follow-up care is a key part of your treatment and safety. Be sure to make and go to all appointments, and call your doctor if you are having problems. It's also a good idea to know your test results and keep a list of the medicines you take.  How can you care for yourself at home?  Medical treatment   If you stop taking your medicine, your blood pressure will go back up. You may take one or more types of medicine to lower your blood pressure. Be safe with medicines. Take your medicine exactly as prescribed. Call your doctor if you think you are having a problem with your medicine.   Your doctor may suggest that you take one low-dose aspirin (81 mg) a day. This can help reduce your risk of having a stroke or heart attack.   See your doctor regularly. You may need to see the doctor more often at first or until your blood pressure comes down.   If you are taking blood pressure medicine, talk to your doctor before you take decongestants or anti-inflammatory medicine, such as ibuprofen. Some of these medicines can raise blood pressure.   Learn how to check your blood pressure at home.  Lifestyle changes   Stay at a healthy weight. This is especially important if you put on weight around the waist. Losing even 10 pounds can help you lower your blood pressure.   If your doctor recommends it, get more exercise. Walking is a good choice. Bit by bit, increase the amount you walk every day. Try for at least 30 minutes on most days of the week. You also may want to swim, bike, or do other activities.   Avoid or limit alcohol. Talk to your doctor about whether you can drink any alcohol.   Try to limit how much sodium you eat to less than 2,300 milligrams (mg) a day. Your doctor may ask you to try to eat less than 1,500 mg a day.   Eat plenty of fruits (such as bananas  and oranges), vegetables, legumes, whole grains, and low-fat dairy products.   Lower  the amount of saturated fat in your diet. Saturated fat is found in animal products such as milk, cheese, and meat. Limiting these foods may help you lose weight and also lower your risk for heart disease.   Do not smoke. Smoking increases your risk for heart attack and stroke. If you need help quitting, talk to your doctor about stop-smoking programs and medicines. These can increase your chances of quitting for good.  When should you call for help?  Call your doctor now or seek immediate medical care if:   Your blood pressure is much higher than normal (such as 180/110 or higher).   You think high blood pressure is causing symptoms such as:   Severe headache.   Blurry vision.  Watch closely for changes in your health, and be sure to contact your doctor if:   You do not get better as expected.   Where can you learn more?   Go to [email or web address] and sign in to your MyChart account. Enter [phone] in the Search Health Information box to learn more about "High Blood Pressure: After Your Visit."    If you do not have an account, please click on the "Sign Up Now" link.      2006-2014 Healthwise, Incorporated. Care instructions adapted under license by [facility]. This care instruction is for use with your licensed healthcare professional. If you have questions about a medical condition or this instruction, always ask your healthcare professional. Healthwise, Incorporated disclaims any warranty or liability for your use of this information.  Content Version: 10.1.311062; Current as of: November 03, 2012

## 2013-05-19 ENCOUNTER — Encounter

## 2013-05-19 LAB — DRUG PROFILE
Amphetamine Screen, Ur: NEGATIVE ng/mL
Barbiturates: NEGATIVE ng/mL
Benzodiazepines: NEGATIVE ng/mL
Cannabinoids: NEGATIVE ng/mL
Cocaine (Metab.): NEGATIVE ng/mL
Creatinine, Ur: 164.3 mg/dL (ref 20.0–300.0)
Ethanol U, Quan: NEGATIVE %
Fentanyl, Ur: NEGATIVE pg/mL
MEPROBAMATE, REFLEX: NEGATIVE ng/mL
Methadone Screen, Urine: NEGATIVE ng/mL
Opiate Scrn, Ur: NEGATIVE ng/mL
Oxycodone, Urine Confirmation: NEGATIVE ng/mL
PHENCYCLIDINE, REFLEX: NEGATIVE ng/mL
Propoxyphene, Urine: NEGATIVE ng/mL
pH, Urine: 6.2 (ref 4.5–8.9)

## 2013-05-20 MED ORDER — NITROGLYCERIN 0.4 MG SL SUBL
0.4 MG | ORAL_TABLET | SUBLINGUAL | Status: DC | PRN
Start: 2013-05-20 — End: 2013-10-31

## 2013-05-26 NOTE — Telephone Encounter (Signed)
Lee from Dr. Jane Canary office called and stated Richard Sims is having rt. Total knee surgery on 07-11-13 and wants to know if he has to be cleared. Please call her at [phone]. Thank you

## 2013-05-31 ENCOUNTER — Telehealth

## 2013-05-31 NOTE — Telephone Encounter (Signed)
Pt notified for or clearance stress echo 06/17/13 at 845

## 2013-05-31 NOTE — Telephone Encounter (Signed)
Patient called in wanting to know why he was having a Stress test.  I explained that since he has a heart hx it is protocol to have stress test for clearance before any major surgery.  Patient voiced understanding.

## 2013-06-04 ENCOUNTER — Encounter

## 2013-06-06 ENCOUNTER — Encounter

## 2013-06-06 MED ORDER — CELEBREX 200 MG PO CAPS
200 MG | ORAL_CAPSULE | ORAL | Status: DC
Start: 2013-06-06 — End: 2014-09-06

## 2013-06-06 MED ORDER — ROSUVASTATIN CALCIUM 10 MG PO TABS
10 MG | ORAL_TABLET | Freq: Every day | ORAL | Status: DC
Start: 2013-06-06 — End: 2014-01-10

## 2013-06-06 MED ORDER — ESOMEPRAZOLE MAGNESIUM 40 MG PO CPDR
40 MG | ORAL_CAPSULE | Freq: Every day | ORAL | Status: DC
Start: 2013-06-06 — End: 2013-11-30

## 2013-06-13 NOTE — Telephone Encounter (Signed)
Called and s/w Sandy with [employer] and explained to her that the referral stated it was authorized per [employer] with [employer]. She said the web sight was showing that it was expired. I gave her Cicero Duck number to contact,since she was the one whom finished the referral.

## 2013-06-13 NOTE — Telephone Encounter (Signed)
Richard Sims is scheduled for a stress echo on 06/17/13 but his authorization has expired. Case # [id number]

## 2013-06-21 ENCOUNTER — Encounter

## 2013-06-30 NOTE — Telephone Encounter (Signed)
pts wife aware of resultsof mrsa. Done on 06/16/2013

## 2013-07-01 NOTE — Progress Notes (Signed)
Subjective:      Patient ID: Richard Sims is a 62 y.o. male.    Knee Pain   The pain is present in the right knee. The quality of the pain is described as stabbing. The pain is at a severity of 9/10. The pain is moderate. The pain has been constant since onset. Associated symptoms include an inability to bear weight and a loss of motion. Nothing aggravates the symptoms. The treatment provided no relief.   Is scheduled for right knee surgery on November 17th by Dr. Alto Denver.  Needs pre-op exam.  Patient has had multiple occasions to receive general anesthetics and has no problems.  Has had significant osteoarthritis for years.  The left knee implant was done previously by Dr. Jari Pigg.  Patient has had previous bilateral scope surgeries by Dr. Alto Denver.  Has had cardiac evaluation by Dr. Sandi Mariscal.  Labs are pending at this time.  Review of Systems   Constitutional: Negative for fever, chills and fatigue.   HENT: Negative for neck pain and neck stiffness.    Respiratory: Negative for cough, chest tightness and shortness of breath.    Cardiovascular: Negative for chest pain and palpitations.   Gastrointestinal: Negative for nausea, vomiting, diarrhea, constipation and blood in stool.   Genitourinary: Negative for hematuria.   Musculoskeletal: Positive for joint swelling.   Neurological: Negative for dizziness, seizures, weakness and headaches.   Psychiatric/Behavioral: Negative for suicidal ideas, sleep disturbance and self-injury. The patient is not nervous/anxious.        Objective:   Physical Exam   Constitutional: He is oriented to person, place, and time. He appears well-developed and well-nourished.   HENT:   Head: Normocephalic and atraumatic.   Right Ear: External ear normal.   Left Ear: External ear normal.   Nose: Nose normal.   Mouth/Throat: Oropharynx is clear and moist.   Eyes: Conjunctivae and EOM are normal. Pupils are equal, round, and reactive to light.   Neck: Normal range of motion. Neck supple.    Cardiovascular: Normal rate, regular rhythm, S1 normal, S2 normal, normal heart sounds, intact distal pulses and normal pulses.    Pulmonary/Chest: Effort normal and breath sounds normal. No apnea.   Abdominal: Soft. Normal appearance.   Musculoskeletal: Normal range of motion.   Right knee pain. Sig decrease in flexion right knee with crepitus noted on palp. Patella with flex/ext. S/P total knee on left.    Neurological: He is alert and oriented to person, place, and time. He has normal strength and normal reflexes.   Skin: Skin is warm, dry and intact.   Psychiatric: He has a normal mood and affect. His speech is normal and behavior is normal. Judgment and thought content normal. Cognition and memory are normal.   Nursing note and vitals reviewed.      Assessment:      1. Osteoarthritis of right knee    2. S/P total knee replacement, left    3. S/P CABG x 1     2012.  At [facility] in the hybrid room   4. Preoperative exam.         Plan:      I have discontinued Mr. Herwick ketorolac and clopidogrel. I am also having him maintain his Polysaccharide Iron, aspirin, metoprolol, nitroGLYCERIN, CELEBREX, rosuvastatin, esomeprazole, and dutasteride.Pat had prior Cardiology eval with Dr Wonda Horner which Patient states was good and clearance was made. Labs and X-rays habe been obtained but I do not have those reports available to  me currently. Assuming those reports are acceptable,Mr Bernerd Pho should be a good candidate for TKR. Dr Ursula Alert has proposed performing a knee replacement set for 07/11/2013.He should be off of Celebrex and ASA 5-7 days prior to operation.                                                                                                                                   THANK YOU                                                                                                                                    Woodley Petzold W. Kimberlly Norgard,M.D.

## 2013-07-11 ENCOUNTER — Inpatient Hospital Stay: Admit: 2013-07-11 | Disposition: A | Discharge status: Home Health

## 2013-07-11 LAB — HEMOGLOBIN AND HEMATOCRIT
Hematocrit: 40.9 % — ABNORMAL LOW (ref 42–52)
Hemoglobin: 13.9 G/DL — ABNORMAL LOW (ref 14.0–18.0)

## 2013-07-11 LAB — PROTIME-INR
INR: 1.2 — ABNORMAL HIGH (ref 0.87–1.14)
Protime: 14.8 s — ABNORMAL HIGH (ref 11.6–14.2)

## 2013-07-11 LAB — TYPE AND SCREEN: ABO Check: A POS

## 2013-07-12 LAB — HEMOGLOBIN AND HEMATOCRIT
Hematocrit: 35.7 % — ABNORMAL LOW (ref 42–52)
Hemoglobin: 11.9 G/DL — ABNORMAL LOW (ref 14.0–18.0)

## 2013-07-12 LAB — PROTIME-INR
INR: 1.24 — ABNORMAL HIGH (ref 0.87–1.14)
Protime: 15.2 s — ABNORMAL HIGH (ref 11.6–14.2)

## 2013-07-13 LAB — CBC PANEL ITEM
Basophils %: 0.5 % (ref 0–1)
Basophils: 0.04 10*3/uL (ref 0.0–0.20)
Eosinophils %: 0.26 10*3/uL (ref 0.0–0.60)
Eosinophils %: 3.5 % (ref 1–5)
Hematocrit: 31.6 % — ABNORMAL LOW (ref 42–52)
Hemoglobin: 10.9 G/DL — ABNORMAL LOW (ref 14.0–18.0)
Lymphocytes: 1.34 10*3/uL (ref 1.1–4.5)
Lymphocytes: 18.1 % — ABNORMAL LOW (ref 20–40)
MCH: 31.8 PG — ABNORMAL HIGH (ref 27–31)
MCHC: 34.5 G/DL (ref 33–37)
MCV: 92.1 FL (ref 80–94)
MPV: 10.5 FL (ref 10.2–13.2)
Monocytes %: 10 % (ref 0–10)
Monocytes: 0.74 10*3/uL (ref 0.0–0.9)
Neutrophils %: 67.9 % (ref 50–70)
Neutrophils Absolute: 5.03 10*3/uL (ref 1.5–7.5)
Platelets: 129 10*3/uL — ABNORMAL LOW (ref 130–400)
RBC: 3.43 MIL/uL — ABNORMAL LOW (ref 4.7–6.1)
RDW: 12.7 % (ref 11.0–14.0)
WBC: 7.41 10*3/uL (ref 4.8–10.8)

## 2013-07-13 LAB — VITAMIN B12: Vitamin B-12: 362 PG/ML (ref 211–946)

## 2013-07-13 LAB — IRON: Iron: 13 ug/dL — ABNORMAL LOW (ref 59–158)

## 2013-09-20 NOTE — Progress Notes (Signed)
Subjective:      Patient ID: Richard Sims is a 63 y.o. male.    Other  This is a new ([employer] exam) problem. The current episode started today. The problem has been unchanged. Pertinent negatives include no chest pain, chills, coughing, fatigue, fever, nausea, vomiting or weakness. Nothing aggravates the symptoms. He has tried nothing for the symptoms.   Patient here for exam for [employer].    Review of Systems   Constitutional: Negative for fever, chills and fatigue.   Respiratory: Negative for cough, chest tightness and shortness of breath.    Cardiovascular: Negative for chest pain, palpitations and leg swelling.   Gastrointestinal: Negative for nausea, vomiting, diarrhea, constipation and blood in stool.   Genitourinary: Negative for hematuria.   Neurological: Negative for dizziness, weakness and light-headedness.   Psychiatric/Behavioral: Negative for suicidal ideas, sleep disturbance and self-injury. The patient is not nervous/anxious.        Objective:   Physical Exam   Constitutional: He is oriented to person, place, and time. He appears well-developed and well-nourished.   HENT:   Head: Normocephalic and atraumatic.   Right Ear: External ear normal.   Left Ear: External ear normal.   Nose: Nose normal.   Mouth/Throat: Oropharynx is clear and moist.   Eyes: Conjunctivae and EOM are normal. Pupils are equal, round, and reactive to light.   Neck: Normal range of motion. Neck supple.   Cardiovascular: Normal rate, regular rhythm, S1 normal, S2 normal, normal heart sounds, intact distal pulses and normal pulses.    Pulmonary/Chest: Effort normal and breath sounds normal. No apnea.   Abdominal: Soft. Normal appearance.   Musculoskeletal: Normal range of motion.   S/P TKR right knee, ROM has nearly returned to normal.   Neurological: He is alert and oriented to person, place, and time. He has normal strength and normal reflexes.   Skin: Skin is warm, dry and intact.   Psychiatric: He has a normal mood and  affect. His speech is normal and behavior is normal. Judgment and thought content normal. Cognition and memory are normal.   Nursing note and vitals reviewed.      Assessment:      1. Physical exam     for [facility] Certification.   2. CAD (coronary artery disease)    3. Hyperlipidemia    4. Hypertension     BP well controlled, 120/70 presently.   5. S/P CABG x 1    6. S/P TKR (total knee replacement) Active    done 09/10/2012, ROM nearly back to normal. \Left done about 4 years ago.            Plan:      I have discontinued Mr. Delee Polysaccharide Iron. I am also having him maintain his aspirin, metoprolol, nitroGLYCERIN, CELEBREX, rosuvastatin, esomeprazole, and dutasteride.

## 2013-10-31 MED ORDER — NITROGLYCERIN 0.4 MG SL SUBL
0.4 MG | ORAL_TABLET | SUBLINGUAL | Status: DC | PRN
Start: 2013-10-31 — End: 2013-12-22

## 2013-10-31 NOTE — Patient Instructions (Signed)
Please remember to bring all of your medications bottles to your appointment so that we can update your medication list.  Include all medications you take (including prescriptions, over-the-counter, vitamins, and herbals).  We ask that you bring your original medication bottles, as it is difficult to identify medications based on color and shape alone.      We are committed to providing you with the best care possible.  If you receive a survey after visiting one of our offices, please take time to share your experience concerning your physician office visit. These surveys are confidential and no health information about you is shared. We are eager to improve for you and we are counting on your feedback to help make that happen.     Allergies:  Simvastatin     The phone number we currently have to notify you of upcoming appointments, procedures, test results, etc., is [phone] (home) .

## 2013-11-01 NOTE — Progress Notes (Signed)
[facility] [place], [place].  [facility]  [address], [address], [place]  [address]  Phone: [phone]   Fax: [phone]  Office Visit:  10/31/2013    Richard Sims DOB: 10-10-1950, Male, 63 y.o.     Subjective  Richard Sims has no complaints and feels very well.  He has lost 20 pounds.       Patient Active Problem List   Diagnosis Code   . CAD (coronary artery disease) 414.00   . Hyperlipidemia 272.4   . Hypertension 401.9   . S/P CABG x 1 V45.81   . Chest pain 786.50   . Shortness of breath 786.05       Past Medical History   Diagnosis Date   . GERD (gastroesophageal reflux disease)    . Cervical disc disease    . CAD (coronary artery disease)    . Hypertension    . Nephrolithiasis    . S/P CABG x 1      MICS with LIMA to LAD via left mini thoracotomy   . Hyperlipidemia      Dr. Sandi Mariscal manages lipid/liver panel       Past Surgical History   Procedure Laterality Date   . Cardiac catheterization  05/29/11   [facility]     EF  over 60%   . Coronary artery bypass graft  06/02/2011      HYBRID MICS CABG, LT THORACOTOMY APPROACH, LIMA-LAD, PCI DES-RCA   . Joint replacement       LEFT KNEE   . Spine surgery       ACF X 2   . Knee arthroscopy       BILATERAL   . Cardiac catheterization  12/12/2011  [facility]     with PTCA, stent to LAD       Family History   Problem Relation Age of Onset   . High Blood Pressure Mother    . Heart Disease Father    . High Blood Pressure Father    . High Cholesterol Father    . Depression Paternal Grandmother        History   Substance Use Topics   . Smoking status: Never Smoker    . Smokeless tobacco: Never Used   . Alcohol Use: Yes      Comment: Occassional       [Allergies/Contraindications:  Simvastatin]    Outpatient Prescriptions Marked as Taking for the 10/31/13 encounter (Office Visit) with Johnathan Heskett [name], MD   Medication Sig Dispense Refill   . nitroGLYCERIN (NITROSTAT) 0.4 MG SL tablet Place 1 tablet under the tongue every 5 minutes as needed for Chest  pain.  25 tablet  5   . dutasteride (AVODART) 0.5 MG capsule Take 0.5 mg by mouth daily.       . CELEBREX 200 MG capsule Take 1 capsule by mouth two times daily  60 capsule  3   . rosuvastatin (CRESTOR) 10 MG tablet Take 1 tablet by mouth daily.  30 tablet  5   . esomeprazole (NEXIUM) 40 MG capsule Take 1 capsule by mouth every morning (before breakfast).  30 capsule  5   . metoprolol (LOPRESSOR) 25 MG tablet Take one-half tablet by mouth twice daily  90 tablet  3   . aspirin 81 MG EC tablet Take 81 mg by mouth daily.             BP Readings from Last 3 Encounters:  10/31/13 122/78   09/20/13 120/70   07/01/13 136/80    Pulse Readings from Last 3 Encounters:   10/31/13 72   09/20/13 64   07/01/13 60        Objective  VITAL SIGNS:  BP 122/78   Pulse 72   Ht 5\' 7"  (1.702 m)   Wt 182 lb (82.555 kg)   BMI 28.50 kg/m2     GENERAL:  The patient is a pleasant 63 y.o. male in no apparent distress.   HEENT:  Unremarkable.    NECK:  Neck is supple.  No jugular venous pressures or bruits.      LUNGS:  The lungs sound clear.    HEART:  The heart's rhythm is regular without murmurs or gallops.       ABDOMEN:  Abdomen is soft without masses or tenderness.       EXTREMITIES:  No edema.  Peripheral pulses intact.  MUSCULOSKELETAL:  No musculoskeletal symptoms.     NEUROLOGICAL:  Cranial nerves II-XII are grossly intact.       Assessment  1.  Stable cardiac status    Patient was examined for:    ICD-9-CM    1. CAD (coronary artery disease) 414.00 nitroGLYCERIN (NITROSTAT) 0.4 MG SL tablet      Plan  1.  Continue current medications.   2.  Wellness visit in six months.  Return to see me in a year.       Beatriz Stallion, M.D., F.A.Alithia Zavaleta.Jayvion Stefanski.  [facility] [place], [place].    Cc:   [name], MD  [address] [place] [address]  [phone] phone  [phone] fax

## 2013-11-30 ENCOUNTER — Encounter

## 2013-12-01 MED ORDER — ESOMEPRAZOLE MAGNESIUM 40 MG PO CPDR
40 MG | ORAL_CAPSULE | Freq: Every day | ORAL | Status: DC
Start: 2013-12-01 — End: 2013-12-05

## 2013-12-05 ENCOUNTER — Encounter

## 2013-12-05 MED ORDER — ESOMEPRAZOLE MAGNESIUM 40 MG PO CPDR
40 MG | ORAL_CAPSULE | Freq: Every day | ORAL | Status: DC
Start: 2013-12-05 — End: 2014-04-17

## 2013-12-22 ENCOUNTER — Encounter

## 2013-12-22 MED ORDER — NITROGLYCERIN 0.4 MG SL SUBL
0.4 MG | ORAL_TABLET | SUBLINGUAL | Status: DC | PRN
Start: 2013-12-22 — End: 2014-08-29

## 2014-01-10 ENCOUNTER — Encounter

## 2014-01-10 MED ORDER — ROSUVASTATIN CALCIUM 10 MG PO TABS
10 MG | ORAL_TABLET | Freq: Every day | ORAL | Status: DC
Start: 2014-01-10 — End: 2014-11-28

## 2014-01-10 MED ORDER — METOPROLOL TARTRATE 25 MG PO TABS
25 MG | ORAL_TABLET | ORAL | Status: DC
Start: 2014-01-10 — End: 2015-01-01

## 2014-02-16 NOTE — Telephone Encounter (Signed)
Pt left message he is needing a letter to recertify for [employer].

## 2014-04-17 ENCOUNTER — Encounter

## 2014-04-18 MED ORDER — ESOMEPRAZOLE MAGNESIUM 40 MG PO CPDR
40 MG | ORAL_CAPSULE | Freq: Every day | ORAL | Status: DC
Start: 2014-04-18 — End: 2014-05-08

## 2014-05-08 MED ORDER — ESOMEPRAZOLE MAGNESIUM 40 MG PO CPDR
40 MG | ORAL_CAPSULE | Freq: Every day | ORAL | Status: DC
Start: 2014-05-08 — End: 2015-01-04

## 2014-05-08 NOTE — Patient Instructions (Addendum)
Please remember to bring all of your medications bottles to your appointment so that we can update your medication list.  Include all medications you take (including prescriptions, over-the-counter, vitamins, and herbals).  We ask that you bring your original medication bottles, as it is difficult to identify medications based on color and shape alone.      We are committed to providing you with the best care possible.  If you receive a survey after visiting one of our offices, please take time to share your experience concerning your physician office visit. These surveys are confidential and no health information about you is shared. We are eager to improve for you and we are counting on your feedback to help make that happen.     Allergies:  Simvastatin     How to take:  NITROGLYCERIN (Nitrostat) 0.4 mg tablets, sublingual.  Nitroglycerin is in a group of drugs called nitrates. Nitroglycerin dilates (widens) blood vessels, making it easier for blood to flow through them and easier for the heart to pump.    Dosing Guidelines for Nitroglycerin Tablets   At the start of an angina (chest pain) attack, place one tablet under the tongue or between the cheek and gum.  Do not swallow or chew the tablet; let it dissolve on its own.  If necessary, a second and third tablet may be used, with five minutes between using each tablet.  If you use a third tablet and your chest pain continues, it is time to seek immediate medical attention.  Call 911 immediately and have someone drive you to the emergency room.  You may be having a heart attack or other serious heart problem.     To prevent angina from exercise or stress, use 1 tablet 5 to 10 minutes before the activity.

## 2014-05-09 NOTE — Progress Notes (Signed)
[facility] [place], [place].  [facility]  [address], [address], [place]  [address]  Phone: [phone]  Fax: [phone]    Richard Sims DOB: [birth year], Male, 63 y.o.     Office Visit:  05/08/2014    Chief Complaint   Patient presents with   . 6 Month Follow-Up     Patient doing well. Denies cardiac symptoms. Cardiology manages lipids. Refills requested and sent to pharmacy.      . Coronary Artery Disease   . Hyperlipidemia       HPI:  Richard Sims had some vague chest and left shoulder discomfort when going to or coming out of the [employer].  At that time it was very hot and he had consumed several beers, and he said that everybody that was with him was quite diaphoretic.  Since then, he has been very active, rode his bike seven miles the other day, and he has had no angina.       Patient Active Problem List   Diagnosis Code   . CAD (coronary artery disease) 414.00   . Hyperlipidemia 272.4   . Hypertension 401.9   . S/P CABG x 1 V45.81   . Chest pain 786.50   . Shortness of breath 786.05       Past Medical History   Diagnosis Date   . GERD (gastroesophageal reflux disease)    . Cervical disc disease    . CAD (coronary artery disease)    . Hypertension    . Nephrolithiasis    . S/P CABG x 1      MICS with LIMA to LAD via left mini thoracotomy   . Hyperlipidemia      Dr. Sandi Mariscal manages lipid/liver panel       Past Surgical History   Procedure Laterality Date   . Cardiac catheterization  05/29/11   [facility]     EF  over 60%   . Coronary artery bypass graft  06/02/2011      HYBRID MICS CABG, LT THORACOTOMY APPROACH, LIMA-LAD, PCI DES-RCA   . Joint replacement       LEFT KNEE   . Spine surgery       ACF X 2   . Knee arthroscopy       BILATERAL   . Cardiac catheterization  12/12/2011  [facility]     with PTCA, stent to LAD       Family History   Problem Relation Age of Onset   . High Blood Pressure Mother    . Heart Disease Father    . High Blood Pressure  Father    . High Cholesterol Father    . Depression Paternal Grandmother        History   Substance Use Topics   . Smoking status: Never Smoker    . Smokeless tobacco: Never Used   . Alcohol Use: Yes      Comment: Occassional       [Allergies/Contraindications:  Simvastatin]     Outpatient Prescriptions Marked as Taking for the 05/08/14 encounter (Office Visit) with Anner Crete, MD   Medication Sig Dispense Refill   . esomeprazole (NEXIUM) 40 MG capsule Take 1 capsule by mouth every morning (before breakfast)  90 capsule  3   . rosuvastatin (CRESTOR) 10 MG tablet Take 1 tablet by mouth daily  90 tablet  3   . metoprolol (LOPRESSOR) 25 MG tablet Take one-half tablet by  mouth twice daily  90 tablet  3   . nitroGLYCERIN (NITROSTAT) 0.4 MG SL tablet Place 1 tablet under the tongue every 5 minutes as needed for Chest pain  75 tablet  2   . dutasteride (AVODART) 0.5 MG capsule Take 0.5 mg by mouth daily.       . CELEBREX 200 MG capsule Take 1 capsule by mouth two times daily  60 capsule  3   . aspirin 81 MG EC tablet Take 81 mg by mouth daily.             Review of Systems:  Constitutional:  Negative for fatigue.  Negative for fever, chills, diaphoresis, activity change, appetite change or unexpected weight change.  HENT:  Negative for nosebleeds, facial swelling, rhinorrhea and neck stiffness.  RESPIRATORY:  Negative for shortness of breath.  Negative for wheezing, strider or cough.    CARDIOVASCULAR:  Negative for chest pain or palpitations.    GASTROINTESTINAL:   Negative for abdominal distention.  GENITOURINARY:  Negative for dysuria, urgency and frequency.  MUSCULOSKELETAL:   No leg swelling.  Negative for myalgia, arthralgia and gait problem.  SKIN:  Negative for color change, pallor, rash and wound.  NEUROLOGICAL:   Negative for dizziness, tremors, speech difficulty, weakness and numbness.  HEMATOLOGICAL:   No bleeding disorders.    PSYCHIATRIC/BEHAVIORAL:   No excessive anxiety or confusion.      Physical Exam:  GENERAL:  On physical examination, he is alert and oriented and in no acute distress.    VITAL SIGNS:  Blood pressure 128/72, pulse 72, height 5\' 7"  (1.702 m), weight 187 lb 9.6 oz (85.095 kg).  Body mass index is 29.38 kg/(m^2).  HEENT:   Head is normocephalic.  NECK:   Supple without increased jugular venous pressures.       LUNGS:   Lungs are clear bilaterally.      HEART:   Regular rate and rhythm.  No audible murmurs or gallops.  ABDOMEN:   No tenderness or masses.     EXTREMITIES:   No signs of significant edema.  No clubbing or cyanosis.      NEUROLOGICAL:   Cranial nerves II through XII are grossly intact.          Assessment:  1.  Appears stable cardiac wise.        Patient was examined for:    ICD-9-CM    1. ASHD (arteriosclerotic heart disease) 414.00 Lipid Panel     AST     ALT   2. Hyperlipidemia 272.4 Lipid Panel     AST     ALT       Plan:  1.  Call if symptoms reoccur.    2.  Return in six months.    3.  We have ordered a lipid and liver panel for the near future.          Beatriz Stallion, M.D., F.A.Jaimi Belle.Laymond Postle.   [facility]     Cc:   [name], MD  [address] [place] [address]  [phone] phone  [phone] fax

## 2014-08-04 ENCOUNTER — Inpatient Hospital Stay: Admit: 2014-08-04 | Discharge: 2014-08-04 | Payer: PRIVATE HEALTH INSURANCE | Primary: Family Medicine

## 2014-08-04 DIAGNOSIS — M5136 Other intervertebral disc degeneration, lumbar region: Secondary | ICD-10-CM

## 2014-08-10 NOTE — H&P (Signed)
PAIN MANAGEMENT OFFICE VISIT     ADMIT DATE:  08/04/2014    HISTORY OF PRESENT ILLNESS   Patient is a 63 year old male who we follow for chronic back and leg pain.   This is a patient that comes in for lumbar epidural injections when he is  having a flare.  Patient had his last injection on August 10, 2013.  At  that time, he did very well with his injection and states that it always  gives him a significant amount of relief.  He states that he has made it a  whole year without any significant pain.  However, he is starting to  experience pain once again in his back, down into his lower extremities.  He  states that he and Dr. Sandria Manly had discussed another injection when he developed  symptoms again.  He states that he was told to call our office, let us know  that he was ready for an injection, which is the reason for his visit today.   He states that he feels like that he needs another injection and would like  to schedule that as soon as possible.  This is a patient that takes very  minimal medication; in fact, has not had a prescription since August 08, 2013.  At that time he had only been given a prescription for tramadol 50 mg  by Dr. Alto Denver secondary to a procedure.  He does not take any chronic narcotic  medications on a daily basis.     REVIEW OF SYSTEMS  In regards to review of systems, there have been no changes other than what  is noted in his HPI.    IMPRESSION  1.  Lumbar degenerative disk disease.  2.  Lumbar radiculopathy.    PLAN  1.  At this time, I am going to schedule him for a lumbar epidural injection.   He was instructed to have a driver the day of his procedure.  He is to go  home that day, remain inactive, and he may resume normal activities the day  following.  2.  At this time, he does not require any medications as he does not take any  medications on a daily basis.   3.  Patient will follow up with at the next available time for his injection  and call sooner should he have any questions  or concerns prior to that time.        ________________________________  Dictated by Florencia Reasons, PA-C   for Roosvelt Maser Sandria Manly, MD    [id number]  DD: 08/10/2014 21:50  DT: 08/10/2014 22:43  SSI File#: [id number]  Job #: [id number]    CC: [name], MD

## 2014-08-11 NOTE — Progress Notes (Signed)
Note dictated [id number]

## 2014-08-13 NOTE — Progress Notes (Signed)
H&P Dictated  #[id number]

## 2014-08-29 ENCOUNTER — Encounter

## 2014-08-29 MED ORDER — NITROGLYCERIN 0.4 MG SL SUBL
0.4 MG | ORAL_TABLET | SUBLINGUAL | Status: DC | PRN
Start: 2014-08-29 — End: 2014-08-31

## 2014-08-31 ENCOUNTER — Encounter

## 2014-08-31 MED ORDER — NITROGLYCERIN 0.4 MG SL SUBL
0.4 MG | ORAL_TABLET | SUBLINGUAL | Status: DC | PRN
Start: 2014-08-31 — End: 2017-06-23

## 2014-09-07 MED ORDER — CELECOXIB 200 MG PO CAPS
200 MG | ORAL_CAPSULE | ORAL | Status: DC
Start: 2014-09-07 — End: 2015-08-15

## 2014-09-11 ENCOUNTER — Inpatient Hospital Stay
Admit: 2014-09-11 | Discharge: 2014-09-11 | Payer: PRIVATE HEALTH INSURANCE | Attending: Pain Medicine | Primary: Family Medicine

## 2014-09-11 DIAGNOSIS — M5116 Intervertebral disc disorders with radiculopathy, lumbar region: Secondary | ICD-10-CM

## 2014-09-11 MED ORDER — METHYLPREDNISOLONE ACETATE 80 MG/ML IJ SUSP
80 MG/ML | INTRAMUSCULAR | Status: AC
Start: 2014-09-11 — End: ?

## 2014-09-11 MED ORDER — SODIUM CHLORIDE 0.9 % IJ SOLN
0.9 % | Freq: Once | INTRAMUSCULAR | Status: AC | PRN
Start: 2014-09-11 — End: 2014-09-11
  Administered 2014-09-11: 22:00:00 4

## 2014-09-11 MED ORDER — SODIUM CHLORIDE 0.9 % IJ SOLN
0.9 % | INTRAMUSCULAR | Status: AC
Start: 2014-09-11 — End: ?

## 2014-09-11 MED ORDER — BUPIVACAINE HCL (PF) 0.25 % IJ SOLN
0.25 % | INTRAMUSCULAR | Status: AC
Start: 2014-09-11 — End: ?

## 2014-09-11 MED ORDER — BUPIVACAINE HCL (PF) 0.25 % IJ SOLN
0.25 % | Freq: Once | INTRAMUSCULAR | Status: AC | PRN
Start: 2014-09-11 — End: 2014-09-11
  Administered 2014-09-11: 22:00:00 5 via EPIDURAL

## 2014-09-11 MED ORDER — METHYLPREDNISOLONE ACETATE 80 MG/ML IJ SUSP
80 MG/ML | Freq: Once | INTRAMUSCULAR | Status: AC | PRN
Start: 2014-09-11 — End: 2014-09-11
  Administered 2014-09-11: 22:00:00 80 via EPIDURAL

## 2014-09-11 MED ORDER — LIDOCAINE-EPINEPHRINE 1 %-1:200000 IJ SOLN
1 percent-:200000 | Freq: Once | INTRAMUSCULAR | Status: AC | PRN
Start: 2014-09-11 — End: 2014-09-11
  Administered 2014-09-11: 22:00:00 2 via EPIDURAL

## 2014-09-11 MED ORDER — LIDOCAINE HCL (PF) 1 % IJ SOLN
1 % | Freq: Once | INTRAMUSCULAR | Status: AC | PRN
Start: 2014-09-11 — End: 2014-09-11
  Administered 2014-09-11: 22:00:00 3 via SUBCUTANEOUS

## 2014-09-11 MED ORDER — IOHEXOL 240 MG/ML IJ SOLN
240 MG/ML | Freq: Once | INTRAMUSCULAR | Status: AC | PRN
Start: 2014-09-11 — End: 2014-09-11
  Administered 2014-09-11: 22:00:00 2 via EPIDURAL

## 2014-09-11 MED FILL — SODIUM CHLORIDE 0.9 % IJ SOLN: 0.9 % | INTRAMUSCULAR | Qty: 10

## 2014-09-11 MED FILL — SENSORCAINE-MPF 0.25 % IJ SOLN: 0.25 % | INTRAMUSCULAR | Qty: 10

## 2014-09-11 MED FILL — DEPO-MEDROL 80 MG/ML IJ SUSP: 80 MG/ML | INTRAMUSCULAR | Qty: 1

## 2014-09-11 NOTE — Procedures (Signed)
DATE: 09/11/2014      REASON FOR VISIT: Principal Problem:    Low back pain radiating to left leg        PROCEDURE: Lumbar Epidural Steroid Injection.   []  Moderate Sedation    DESCRIPTION OF PROCEDURE:              After obtaining informed consent, the patient was taken to the procedure room, positioned prone, and sterilely prepped.  The procedure was performed under fluoroscopic guidance.  First 5 ml of 1% Xylocaine was used at L4 - 5 for local anesthesia.  A 19-gauge Hustead needle was advanced to the epidural space.  The was confirmed on the fluoroscope with an injection of [x]  2ml  []  ml Contrast.  Then, [x]  5ml of 0.25% Marcaine, [x]  4ml of Normal Saline, and [x]  80 mg of Depo Medrol was gently injected.        There were no complications.    DIAGNOSES:  []  Low Back Pain  [x]  *Lumbar Radiculopathy  [x]  *Lumbar Degenerative Disc Disease  []  *Lumbar Spinal Stenosis  []  *Lumbar Postlaminectomy Syndrome  []  Other    PLAN:  [x]  Will return to office in  1 month(s) for  [x]  Planned Procedure  []  Office Visit  []  Prescriptions were given today   []  No prescriptions needed today  []  Patient is to call with any questions or concerns which may arise prior to the next office visit.    COMMENTS:  The patient returns today. He complains of low back pain that radiates down his left leg. Pain level is a 4. He states he had 90% pain relief for about a year after his last injection. Since his last visit, he has had both knees replaced by Dr. Alto Denver and states he has had much improvement. He is not on any blood thinners. He has no new health issues. Discussed the risks and complications with the patient.         Beatrix Fetters, MD    [x]  Over 50% of today's appointment was given to discussion, evaluation and counseling.

## 2014-09-11 NOTE — Discharge Instructions (Signed)
Discharge criteria met.  Post procedure dressing dry and intact.  Sensory and motor function intact as per pre-procedure.  Instructions and follow up reviewed with pt at patient at discharge.  Dr. Claudie Revering  Discharge Instructions / Information        You have had the procedure(s) called:    []  Cervical Epidural     [x]  Lumbar Epidural     []  _____________Injection  []  Cervical Facets  []  Lumbar Facets []  Radiofrequency Lesioning  []  Occipital Nerve Blocks []  Caudal Epidural []  Transforaminal Epidural  []  Trigger Point Injections []  SI Joint Injection []  ______________________    Please follow these instructions carefully.      If you have questions or problems you may call [phone].     You have received the following medications:  [x]  Lidocaine []  Bupivicaine: []  0.50%   [x]  0.25% []  DepoMedrol   [x]  Normal Saline  []  Versed []  Sodium Bicarbonate   []  Isovue   []  Botox  []  Kenalog   []  Diprovan  [x]  omnipaque_____________________    PATIENT INFORMATION:  You may experience the following symptoms after your procedure.  These symptoms are normal and should not cause alarm.     An increase in our pain may occurr.  This may last 24-48 hours after your procedure.   No change in pain.   Weakness or numbness in your affected extremity.  This will usually only last a few hours.    REMEMBER TO REPORT THE FOLLOWING SYMPTOMS TO YOUR DOCTOR:   Redness, swelling. Or drainage at the injection site.   Unusual pain that interferes with your usual activities of daily living.    OTHER INSTRUCTIONS:  [x]  I will apply ice to the injection site for 24 hours.  Apply 15-2- minutes and take off 40 minutes.  [x]  I understand if a steroid was used it will take effect in 3-5 days.  [x]  I have received my personal belongings and valuable.  [x]  I have received a copy of my discharge instructions and understand to my satisfaction.  [x]  I will not drive for [x]  12 hours    []  24 hours after my injections.  [x]  I understand that today I  will  [x]  rest  []  walk and move freely  []  other _________________  []  Additional instructions:  ____________________________________________________________________________        Patient Discharge:  [x]  Home  []  Hospital  []  Other  ___________________________________    How:  []  Ambulatory  [x]  Wheelchair   []  Stretcher   []  __________________________________    Accompanied by:  []  Family Member  []  [facility] Staff  []  Ambulance Staff    []  Other_____________________________

## 2014-10-11 ENCOUNTER — Ambulatory Visit: Payer: MEDICARE | Attending: Pain Medicine | Primary: Family Medicine

## 2014-10-16 ENCOUNTER — Inpatient Hospital Stay
Admit: 2014-10-16 | Discharge: 2014-10-16 | Payer: PRIVATE HEALTH INSURANCE | Attending: Pain Medicine | Primary: Family Medicine

## 2014-10-16 ENCOUNTER — Inpatient Hospital Stay: Admit: 2014-10-16 | Discharge: 2014-10-16 | Payer: PRIVATE HEALTH INSURANCE | Primary: Family Medicine

## 2014-10-16 ENCOUNTER — Encounter

## 2014-10-16 DIAGNOSIS — M5136 Other intervertebral disc degeneration, lumbar region: Secondary | ICD-10-CM

## 2014-10-16 DIAGNOSIS — M5116 Intervertebral disc disorders with radiculopathy, lumbar region: Secondary | ICD-10-CM

## 2014-10-16 MED ORDER — METHYLPREDNISOLONE ACETATE 80 MG/ML IJ SUSP
80 MG/ML | Freq: Once | INTRAMUSCULAR | Status: AC | PRN
Start: 2014-10-16 — End: 2014-10-16
  Administered 2014-10-16: 20:00:00 80 via EPIDURAL

## 2014-10-16 MED ORDER — IOHEXOL 240 MG/ML IJ SOLN
240 MG/ML | Freq: Once | INTRAMUSCULAR | Status: AC | PRN
Start: 2014-10-16 — End: 2014-10-16
  Administered 2014-10-16: 20:00:00 2 via EPIDURAL

## 2014-10-16 MED ORDER — SODIUM CHLORIDE 0.9 % IJ SOLN
0.9 % | Freq: Once | INTRAMUSCULAR | Status: AC | PRN
Start: 2014-10-16 — End: 2014-10-16
  Administered 2014-10-16: 20:00:00 4

## 2014-10-16 MED ORDER — TIZANIDINE HCL 4 MG PO TABS
4 MG | ORAL_TABLET | Freq: Two times a day (BID) | ORAL | Status: DC | PRN
Start: 2014-10-16 — End: 2015-01-15

## 2014-10-16 MED ORDER — LIDOCAINE HCL (PF) 1 % IJ SOLN
1 % | Freq: Once | INTRAMUSCULAR | Status: AC | PRN
Start: 2014-10-16 — End: 2014-10-16
  Administered 2014-10-16: 20:00:00 3 via SUBCUTANEOUS

## 2014-10-16 MED ORDER — BUPIVACAINE HCL (PF) 0.25 % IJ SOLN
0.25 % | INTRAMUSCULAR | Status: AC
Start: 2014-10-16 — End: ?

## 2014-10-16 MED ORDER — LIDOCAINE-EPINEPHRINE 1 %-1:200000 IJ SOLN
1 percent-:200000 | Freq: Once | INTRAMUSCULAR | Status: AC | PRN
Start: 2014-10-16 — End: 2014-10-16
  Administered 2014-10-16: 20:00:00 2 via EPIDURAL

## 2014-10-16 MED ORDER — SODIUM CHLORIDE 0.9 % IJ SOLN
0.9 % | INTRAMUSCULAR | Status: AC
Start: 2014-10-16 — End: ?

## 2014-10-16 MED ORDER — BUPIVACAINE HCL (PF) 0.25 % IJ SOLN
0.25 % | Freq: Once | INTRAMUSCULAR | Status: AC | PRN
Start: 2014-10-16 — End: 2014-10-16
  Administered 2014-10-16: 20:00:00 5 via EPIDURAL

## 2014-10-16 MED ORDER — METHYLPREDNISOLONE ACETATE 80 MG/ML IJ SUSP
80 MG/ML | INTRAMUSCULAR | Status: AC
Start: 2014-10-16 — End: ?

## 2014-10-16 MED FILL — SENSORCAINE-MPF 0.25 % IJ SOLN: 0.25 % | INTRAMUSCULAR | Qty: 10

## 2014-10-16 MED FILL — DEPO-MEDROL 80 MG/ML IJ SUSP: 80 MG/ML | INTRAMUSCULAR | Qty: 1

## 2014-10-16 MED FILL — SODIUM CHLORIDE 0.9 % IJ SOLN: 0.9 % | INTRAMUSCULAR | Qty: 10

## 2014-10-16 NOTE — Progress Notes (Signed)
Procedure:  Level of Consciousness: [x] Alert [] Oriented [] Disoriented [] Lethargic  Anxiety Level: [x] Calm [] Anxious [] Depressed [] Other  Skin: [x] Warm [x] Dry [] Cool [] Moist [] Intact [] Other  Cardiovascular: [] Palpitations: [x] Never [] Occasionally [] Frequently  Chest Pain: [x] No [] Yes  Respiratory:  [x] Unlabored [] Labored [] Cough ([]  Productive [] Unproductive)  HCG Required: [x] No [] Yes   Results: [] Negative [] Positive  Knowledge Level:        [x] Patient/Other verbalized understanding of pre-procedure instructions.        [x] Assessment of post-op care needs (transportation, responsible caregiver)        [] Able to discuss health care problems and how to deal with it.  Factors that Affect Teaching:        Language Barrier: [] No [] Yes - why:        Hearing Loss:        [x] No [] Yes            Corrective Device:  [] Yes [] No        Vision Loss:           [] No [x] Yes            Corrective Device:  [x] Yes [] No        Memory Loss:       [] No [x] Yes            [x] Short Term [x] Long Term  Motivational Level:  [x] Asks Questions                  [] Extremely Anxious       [] Seems Interested               [] Seems Uninterested                  [] Denies need for Education  Risk for Injury:  Nursing Admission Record    Current Medication Regimen Working?  na  Current Issues / Falls / ER Visits:  No    Percentage of Pain Relief after Last Procedure:  80 %    How long lasted:  3 months    Radiology exams received during the last 12 months: No       When                                               Where       Imaging on chart: No         Imaging records requested: No  Physical therapy during the last 12 months: No       When:                                              Where   Labs during the last 12 months: Yes    Education Provided:  [x]  Review of [place]  []  Agreement Review  []  Compliance Issues Discussed    []  Cognitive Behavior Needs []  Exercise []  Review of Test []  Financial Issues  []  Tobacco/Alcohol Use [x]  Teaching []  New  Patient []  Picture Obtained    Physician Plan:  []  Outgoing Referral  []  Pharmacy Consult  []  Test Ordered   []  Obtained Test Results / Consult Notes    []  Suspected Physical Abuse or Suicide Risk assessed - IF YES COMPLETE QUESTIONS BELOW    If any of  the following questions are answered yes - contact attending physician for referral:    Has been considering harming self to escape stress, pain problems?  []  YES  []  NO  Has a suicide plan? []  YES  []  NO  Has attempted suicide in the past?   []  YES  []  NO  Has a close friend or family member who committed suicide?  []  YES  []  NO    Patient Referred To :     Additional Notes:    Assessment Completed by:  Electronically signed by Lucy Antigua, RN on 10/16/2014 at 2:44 PM[x] Patient oriented to person, place and time  [] History of frequent falls/loss of balance  Nutritional:  [] Change in appetite   [] Weight Gain   [] Weight Loss  Functional:  [] Requires assistance with ADL's

## 2014-10-16 NOTE — Discharge Instructions (Signed)
Dr. Canary Brim  Discharge Instructions / Information        You have had the procedure(s) called:    _0  Cervical Epidural     _1  Lumbar Epidural     _2  _____________Injection  _3  Cervical Facets  _4  Lumbar Facets _5  Radiofrequency Lesioning  _6  Occipital Nerve Blocks _7  Caudal Epidural _8  Transforaminal Epidural  _9  Trigger Point Injections _10  SI Joint Injection _11  ______________________    Please follow these instructions carefully.      If you have questions or problems you may call [phone].     You have received the following medications:  _12  Lidocaine _13  Bupivicaine: _14  0.50%   _15  0.25% _16  DepoMedrol   _17  Normal Saline  _18  Versed _19  Sodium Bicarbonate   _20  Isovue   _21  Botox  _22  Kenalog   _23  Diprovan  _24  __omnipaque___________________    PATIENT INFORMATION:  You may experience the following symptoms after your procedure.  These symptoms are normal and should not cause alarm.     An increase in our pain may occurr.  This may last 24-48 hours after your procedure.   No change in pain.   Weakness or numbness in your affected extremity.  This will usually only last a few hours.    REMEMBER TO REPORT THE FOLLOWING SYMPTOMS TO YOUR DOCTOR:   Redness, swelling. Or drainage at the injection site.   Unusual pain that interferes with your usual activities of daily living.    OTHER INSTRUCTIONS:  _25  I will apply ice to the injection site for 24 hours.  Apply 15-2- minutes and take off 40 minutes.  _26  I understand if a steroid was used it will take effect in 3-5 days.  _27  I have received my personal belongings and valuable.  _28  I have received a copy of my discharge instructions and understand to my satisfaction.  _29  I will not drive for <[id number]>_3<\/YQMVHQIONGEXBMWU>_13  12 hours    _31  24 hours after my injections.  _32  I understand that today I will  _33  rest  _34  walk and move freely  _35  other _________________  _36  Additional instructions:  ____________________________________________________________________________        Patient  Discharge:  _37  Home  _38  Hospital  _39  Other  ___________________________________    How:  _40  Ambulatory  _41  Wheelchair   _42  Stretcher   _43  __________________________________    Accompanied by:  _44  Family Member  _45  [facility] Staff  _47  Ambulance Staff    _48  Other_____________________________    Discharge criteria met.  Post procedure dressing dry and intact.  Sensory and motor function intact as per pre-procedure.  Instructions and follow up reviewed with pt at patient at discharge.    Must have a driver present in the office for your next scheduled procedure

## 2014-10-16 NOTE — Procedures (Signed)
DATE: 10/16/2014      REASON FOR VISIT: Principal Problem:    Low back pain radiating to left leg        PROCEDURE: Lumbar Epidural Steroid Injection.   []  Moderate Sedation    DESCRIPTION OF PROCEDURE:              After obtaining informed consent, the patient was taken to the procedure room, positioned prone, and sterilely prepped.  The procedure was performed under fluoroscopic guidance.  First 5 ml of 1% Xylocaine was used at L5 - S1 for local anesthesia.  A 19-gauge Hustead needle was advanced to the epidural space.  The was confirmed on the fluoroscope with an injection of [x]  2ml  []  ml Contrast.  Then, [x]  5ml of 0.25% Marcaine, [x]  4ml of Normal Saline, and [x]  80 mg of Depo Medrol was gently injected.        There were no complications.    DIAGNOSES:  [x]  Low Back Pain  [x]  *Lumbar Radiculopathy  [x]  *Lumbar Degenerative Disc Disease  []  *Lumbar Spinal Stenosis  []  *Lumbar Postlaminectomy Syndrome  []  Other    PLAN:  [x]  Will return to office in  3 month(s)for  [x]  Planned Procedure  []  Office Visit  []  Prescriptions were given today   []  No prescriptions needed today  []  Patient is to call with any questions or concerns which may arise prior to the next office visit.    COMMENTS:  The patient returns today. He complains of low back pain that occassionally radiates down his left leg. Pain level is a 3. He states he had 80% pain relief for about 2 months after the last injections. He is looking forward to the injections today.     Today we are repeating lumbar epidural injections. Discussed the risks and complications with the patient.        Richard Fettersiley [name], MD                []  Over 50% of today's appointment was given to discussion, evaluation and counseling.

## 2014-11-06 ENCOUNTER — Ambulatory Visit
Admit: 2014-11-06 | Discharge: 2014-11-06 | Payer: PRIVATE HEALTH INSURANCE | Attending: Cardiovascular Disease | Primary: Family Medicine

## 2014-11-06 DIAGNOSIS — I2510 Atherosclerotic heart disease of native coronary artery without angina pectoris: Secondary | ICD-10-CM

## 2014-11-07 NOTE — Progress Notes (Signed)
The following was transcribed by Suella Broad, MT     [facility] [address] [place], [address], [address], [place]  [address]    Richard Sims DOB: 05-29-1951, Male, 64 y.o.     Office Visit:  11/06/2014    Chief Complaint   Patient presents with   . 6 Month Follow-Up     Patietn is here for a follow up. Dr Hale Bogus follows up with lipids. no rx refills needed.   . Coronary Artery Disease   . Dysmenorrhea     Richard Sims/o having cramps in his legs.         HPI:   Richard Sims is followed in this office for coronary artery disease with remote bypass surgery as well as stenting to the right coronary artery and the distal LAD.  He recently has had some leg cramps, particularly at night.  He has no chest pain or shortness of breath.       Patient Active Problem List   Diagnosis Code   . CAD (coronary artery disease) I25.10   . Hyperlipidemia E78.5   . Hypertension I10   . S/P CABG x 1 Z95.1   . Chest pain R07.9   . Shortness of breath R06.02   . Lumbar degenerative disc disease M51.36   . Lumbar radiculopathy M54.16   . Low back pain radiating to left leg M54.5       Past Medical History   Diagnosis Date   . GERD (gastroesophageal reflux disease)    . Cervical disc disease    . CAD (coronary artery disease)    . Hypertension    . Nephrolithiasis    . S/P CABG x 1      MICS with LIMA to LAD via left mini thoracotomy   . Hyperlipidemia      [facility] manage his lipid/liver panel   . Lumbar degenerative disc disease 08/11/2014   . Lumbar radiculopathy 08/11/2014   . Low back pain radiating to left leg 09/11/2014       Past Surgical History   Procedure Laterality Date   . Cardiac catheterization  05/29/11   [facility]     EF  over 60%   . Coronary artery bypass graft  06/02/2011      HYBRID MICS CABG, LT THORACOTOMY APPROACH, LIMA-LAD, PCI DES-RCA   . Joint replacement       LEFT KNEE   . Spine surgery       ACF X 2   . Knee arthroscopy       BILATERAL   . Cardiac  catheterization  12/12/2011  [facility]     with PTCA, stent to LAD       [Allergies/Contraindications:  Simvastatin]     Outpatient Prescriptions Marked as Taking for the 11/06/14 encounter (Office Visit) with Anner Crete, MD   Medication Sig Dispense Refill   . RAPAFLO 8 MG CAPS      . tiZANidine (ZANAFLEX) 4 MG tablet Take 1 tablet by mouth 2 times daily as needed (one month supply) 45 tablet 2   . celecoxib (CELEBREX) 200 MG capsule Take 1 capsule by mouth  twice a day 180 capsule 1   . nitroGLYCERIN (NITROSTAT) 0.4 MG SL tablet Place 1 tablet under the tongue every 5 minutes as needed for Chest pain up to 3 doses, in no relief report to the ER. 25 tablet 3   . esomeprazole (NEXIUM) 40 MG  capsule Take 1 capsule by mouth every morning (before breakfast) 90 capsule 3   . rosuvastatin (CRESTOR) 10 MG tablet Take 1 tablet by mouth daily 90 tablet 3   . metoprolol (LOPRESSOR) 25 MG tablet Take one-half tablet by mouth twice daily 90 tablet 3   . dutasteride (AVODART) 0.5 MG capsule Take 0.5 mg by mouth daily.     Marland Kitchen aspirin 81 MG EC tablet Take 81 mg by mouth daily.           Family History   Problem Relation Age of Onset   . High Blood Pressure Mother    . Heart Disease Father    . High Blood Pressure Father    . High Cholesterol Father    . Depression Paternal Grandmother        History   Substance Use Topics   . Smoking status: Never Smoker    . Smokeless tobacco: Never Used   . Alcohol Use: Yes      Comment: Occassional       Review of Systems  Constitutional:  Negative for fatigue.  No fever, chills, diaphoresis or activity change, appetite change or unexpected weight change.  HENT:  Negative for nosebleeds, facial swelling, rhinorrhea and neck stiffness.  RESPIRATORY:  Negative for shortness of breath.  No chest tightness, apnea, cough, wheezing or stridor.   CARDIOVASCULAR:  Negative for chest pain.  No palpitations.  No leg swelling.  GASTROINTESTINAL:   Negative for abdominal distention.  GENITOURINARY:   Negative for dysuria, urgency and frequency.  MUSCULOSKELETAL:   Positive for bilateral leg cramps.  Negative for myalgia, arthralgia and gait problem.  SKIN:  Negative for color change, pallor, rash and wound.  NEUROLOGICAL:   Negative for dizziness.  No tremors, speech difficulty or numbness.  HEMATOLOGICAL:   Does not bruise or bleed easily.  PSYCHIATRIC/BEHAVIORAL:   No excessive anxiety or confusion.       Physical Examination  GENERAL:  The patient is a pleasant 64 y.o. male in no apparent distress.      VITAL SIGNS:  BP 116/74 mmHg  Pulse 64  Ht 5\' 7"  (1.702 m)  Wt 188 lb (85.276 kg)  BMI 29.44 kg/m2   HEAD:  Normocephalic without evidence of old or recent trauma.  EYES:  Sclerae clear.  Conjunctivae pink.  EOMs intact.  Pupils equal and round.  NOSE:  No nasal discharge or epistaxis.    MOUTH:  Teeth, gums and palate normal.   THROAT:  No lesions on lips or buccal mucosa.  Tongue protrudes in midline and is well papillated.  NECK:  Supple without mass or JVD.  No carotid bruits are auscultated.      CHEST:  Clear bilateral breath sounds without wheezes or rhonchi.    RESPIRATORY:  The lungs are clear.  Normal respiratory effort.    CARDIOVASCULAR:  Heart's rhythm is regular without audible murmurs or gallops.     ABDOMEN:  Soft without mass or tenderness.  Active bowel sounds are present.      UPPER EXTREMITY EVALUATION:  Radial pulses palpable bilaterally.      LOWER EXTREMITY EVALUATION:  Negative for peripheral edema.  Femoral, popliteal, dorsalis pedis, and posterior tibialis pulses 2+ to palpation bilaterally.  No cyanosis or clubbing.  GASTROINTESTINAL:  Stool sample not pertinent.  MUSCULOSKELETAL:  Normal muscle strength and tone.  No atrophy or abnormalities.  SKIN:  Warm, dry, intact.  No dermatitis or ulcers.  NEUROLOGIC:  Cranial nerves II through  XII are grossly intact.      Assessment  1.  Leg cramps possibly due to Crestor.          ICD-10-CM ICD-9-CM    1. ASHD (arteriosclerotic heart  disease) I25.10 414.00    2. Bilateral leg cramps R25.2 729.82          Plan  1.  Suspend Crestor for about two weeks to see if this helps.  If not, go ahead and resume it.  I also told him about Coenzyme Q10 which may help with the left aching and cramps.    2.  Continue his current medications.    3.  Wellness visit in six months.  Return to see me in one year.               Beatriz Stallion, M.D., F.A.Richard Sims.Richard Sims.   [facility]     cc (pcp): [name], MD

## 2014-11-27 NOTE — Telephone Encounter (Signed)
Pt stopped his Crestor, per [facility] 11/06/14 appt, and reported severe muscle cramps resolved.  He reported he has taken simvastatin in past and also caused muscle cramps.  Requesting recommendation for different medication.

## 2014-11-28 ENCOUNTER — Encounter

## 2014-11-28 NOTE — Telephone Encounter (Signed)
Left voice message for patient to return call.  Need to discuss getting lipid and liver function labs done.  Lab orders entered.

## 2014-11-28 NOTE — Telephone Encounter (Signed)
Be nice to know what his current lipid status is  No recent labs. If he has some from his PCP, get those. If not have him get lipids and liver checked and we'll go from there  Will probably try pravastatin

## 2014-11-29 NOTE — Telephone Encounter (Signed)
Pt returned call.  Stated he would have his wife call with information on where to send lab orders. Also she was going to check on recent labs.  Advised they needed to have been done no later than 6 months.  He voiced understanding.

## 2014-11-29 NOTE — Telephone Encounter (Signed)
Left another voice message for pt to call about getting labs done for cholesterol.

## 2014-11-29 NOTE — Telephone Encounter (Signed)
Wife requested for lab orders to be mailed to the home address and she would have pt get them done ASAPcloser to his home.

## 2014-12-08 ENCOUNTER — Encounter

## 2014-12-08 MED ORDER — PRAVASTATIN SODIUM 40 MG PO TABS
40 MG | ORAL_TABLET | Freq: Every day | ORAL | Status: DC
Start: 2014-12-08 — End: 2015-01-01

## 2014-12-08 NOTE — Telephone Encounter (Signed)
Spoke with patient and results will be mailed out to him.

## 2014-12-08 NOTE — Telephone Encounter (Signed)
I called and spoke to patient about lab results.  Patient is willing to try the medication.  RX sent to [facility].  Med list updated.

## 2014-12-08 NOTE — Telephone Encounter (Signed)
-----   Message from De Nurse, APRN sent at 12/07/2014 11:24 AM CDT -----  Lipids and liver looked good but only been off Crestor for 2 weeks  See if he is willing to try pravastatin 40 mg daily

## 2014-12-08 NOTE — Telephone Encounter (Addendum)
Pt left a voice message requesting lab results for cholesterol be emailed ([email or web address]) to him.

## 2015-01-01 ENCOUNTER — Encounter

## 2015-01-01 MED ORDER — PRAVASTATIN SODIUM 40 MG PO TABS
40 MG | ORAL_TABLET | Freq: Every day | ORAL | Status: DC
Start: 2015-01-01 — End: 2015-01-04

## 2015-01-01 MED ORDER — METOPROLOL TARTRATE 25 MG PO TABS
25 MG | ORAL_TABLET | ORAL | Status: DC
Start: 2015-01-01 — End: 2015-01-04

## 2015-01-01 NOTE — Telephone Encounter (Signed)
Labs were done on 11-2014 and not due till 11-2015.

## 2015-01-04 ENCOUNTER — Encounter

## 2015-01-04 MED ORDER — PRAVASTATIN SODIUM 40 MG PO TABS
40 MG | ORAL_TABLET | Freq: Every day | ORAL | Status: DC
Start: 2015-01-04 — End: 2015-01-15

## 2015-01-04 MED ORDER — ESOMEPRAZOLE MAGNESIUM 40 MG PO CPDR
40 MG | ORAL_CAPSULE | Freq: Every day | ORAL | Status: DC
Start: 2015-01-04 — End: 2015-01-19

## 2015-01-04 MED ORDER — METOPROLOL TARTRATE 25 MG PO TABS
25 MG | ORAL_TABLET | ORAL | Status: DC
Start: 2015-01-04 — End: 2015-01-19

## 2015-01-09 ENCOUNTER — Encounter

## 2015-01-15 ENCOUNTER — Inpatient Hospital Stay: Admit: 2015-01-15 | Discharge: 2015-01-15 | Payer: MEDICARE | Attending: Pain Medicine | Primary: Family Medicine

## 2015-01-15 ENCOUNTER — Encounter

## 2015-01-15 ENCOUNTER — Inpatient Hospital Stay: Admit: 2015-01-15 | Discharge: 2015-01-15 | Payer: MEDICARE | Primary: Family Medicine

## 2015-01-15 DIAGNOSIS — M5116 Intervertebral disc disorders with radiculopathy, lumbar region: Secondary | ICD-10-CM

## 2015-01-15 DIAGNOSIS — M5136 Other intervertebral disc degeneration, lumbar region: Secondary | ICD-10-CM

## 2015-01-15 MED ORDER — LIDOCAINE HCL (PF) 1 % IJ SOLN
1 % | Freq: Once | INTRAMUSCULAR | Status: AC | PRN
Start: 2015-01-15 — End: 2015-01-15
  Administered 2015-01-15: 21:00:00 3 via SUBCUTANEOUS

## 2015-01-15 MED ORDER — PRAVASTATIN SODIUM 40 MG PO TABS
40 MG | ORAL_TABLET | Freq: Every day | ORAL | Status: DC
Start: 2015-01-15 — End: 2015-01-19

## 2015-01-15 MED ORDER — METHYLPREDNISOLONE ACETATE 80 MG/ML IJ SUSP
80 MG/ML | INTRAMUSCULAR | Status: AC
Start: 2015-01-15 — End: ?

## 2015-01-15 MED ORDER — LIDOCAINE-EPINEPHRINE 1 %-1:200000 IJ SOLN
1 percent-:200000 | Freq: Once | INTRAMUSCULAR | Status: AC | PRN
Start: 2015-01-15 — End: 2015-01-15
  Administered 2015-01-15: 21:00:00 2 via EPIDURAL

## 2015-01-15 MED ORDER — IOHEXOL 240 MG/ML IJ SOLN
240 MG/ML | Freq: Once | INTRAMUSCULAR | Status: AC | PRN
Start: 2015-01-15 — End: 2015-01-15
  Administered 2015-01-15: 21:00:00 2 via EPIDURAL

## 2015-01-15 MED ORDER — METHYLPREDNISOLONE ACETATE 80 MG/ML IJ SUSP
80 MG/ML | Freq: Once | INTRAMUSCULAR | Status: AC | PRN
Start: 2015-01-15 — End: 2015-01-15
  Administered 2015-01-15: 22:00:00 80 via EPIDURAL

## 2015-01-15 MED ORDER — SODIUM CHLORIDE 0.9 % IJ SOLN
0.9 % | INTRAMUSCULAR | Status: AC
Start: 2015-01-15 — End: ?

## 2015-01-15 MED ORDER — SODIUM CHLORIDE 0.9 % IJ SOLN
0.9 % | Freq: Once | INTRAMUSCULAR | Status: AC | PRN
Start: 2015-01-15 — End: 2015-01-15
  Administered 2015-01-15: 22:00:00 4

## 2015-01-15 MED ORDER — TIZANIDINE HCL 4 MG PO TABS
4 MG | ORAL_TABLET | Freq: Two times a day (BID) | ORAL | Status: DC | PRN
Start: 2015-01-15 — End: 2016-01-15

## 2015-01-15 MED ORDER — BUPIVACAINE HCL (PF) 0.25 % IJ SOLN
0.25 % | Freq: Once | INTRAMUSCULAR | Status: AC | PRN
Start: 2015-01-15 — End: 2015-01-15
  Administered 2015-01-15: 22:00:00 5 via EPIDURAL

## 2015-01-15 MED ORDER — BUPIVACAINE HCL (PF) 0.25 % IJ SOLN
0.25 % | INTRAMUSCULAR | Status: AC
Start: 2015-01-15 — End: ?

## 2015-01-15 MED FILL — SODIUM CHLORIDE 0.9 % IJ SOLN: 0.9 % | INTRAMUSCULAR | Qty: 10

## 2015-01-15 MED FILL — DEPO-MEDROL 80 MG/ML IJ SUSP: 80 MG/ML | INTRAMUSCULAR | Qty: 1

## 2015-01-15 MED FILL — SENSORCAINE-MPF 0.25 % IJ SOLN: 0.25 % | INTRAMUSCULAR | Qty: 10

## 2015-01-15 NOTE — Progress Notes (Signed)
Procedure:  Level of Consciousness: [x] Alert [x] Oriented [] Disoriented [] Lethargic  Anxiety Level: [x] Calm [] Anxious [] Depressed [] Other  Skin: [x] Warm [x] Dry [] Cool [] Moist [] Intact [] Other  Cardiovascular: [] Palpitations: [x] Never [] Occasionally [] Frequently  Chest Pain: [x] No [] Yes  Respiratory:  [x] Unlabored [] Labored [] Cough ([]  Productive [] Unproductive)  HCG Required: [x] No [] Yes   Results: [] Negative [] Positive  Knowledge Level:        [] Patient/Other verbalized understanding of pre-procedure instructions.        [] Assessment of post-op care needs (transportation, responsible caregiver)        [] Able to discuss health care problems and how to deal with it.  Factors that Affect Teaching:        Language Barrier: [x] No [] Yes - why:        Hearing Loss:        [x] No [] Yes            Corrective Device:  [] Yes [] No        Vision Loss:           [] No [x] Yes            Corrective Device:  [x] Yes [] No        Memory Loss:       [x] No [] Yes            [] Short Term [] Long Term  Motivational Level:  [x] Asks Questions                  [] Extremely Anxious       [x] Seems Interested               [] Seems Uninterested                  [x] Denies need for Education  Risk for Injury:  [x] Patient oriented to person, place and time  [x] History of frequent falls/loss of balance  Nutritional:  [] Change in appetite   [] Weight Gain   [] Weight Loss  Functional:  [] Requires assistance with ADL'sNursing Admission Record    Current Medication Regimen Working?  Yes    Current Issues / Falls / ER Visits:  No    Percentage of Pain Relief after Last Procedure:  90 %    How long lasted:  3 months    Radiology exams received during the last 12 months: No       When na                                              [facility]       Imaging on chart: no         Imaging records requested: No  Physical therapy during the last 12 months: No       When: na                                             Where na  Labs during the last 12 months:  Yes    Education Provided:  [x]  Review of [place]  [x]  Agreement Review  []  Compliance Issues Discussed    []  Cognitive Behavior Needs [x]  Exercise []  Review of Test []  Financial Issues  []  Tobacco/Alcohol Use [x]  Teaching []  New Patient []  Picture Obtained    Physician Plan:  []  Outgoing Referral  []  Pharmacy Consult  []   Test Ordered   []  Obtained Test Results / Consult Notes    []  Suspected Physical Abuse or Suicide Risk assessed - IF YES COMPLETE QUESTIONS BELOW    If any of the following questions are answered yes - contact attending physician for referral:    Has been considering harming self to escape stress, pain problems?  []  YES  []  NO  Has a suicide plan? []  YES  []  NO  Has attempted suicide in the past?   []  YES  []  NO  Has a close friend or family member who committed suicide?  []  YES  []  NO    Patient Referred To :     Additional Notes:    Assessment Completed by:  Electronically signed by Newt Lukes, RN on 01/15/2015 at 4:16 PM

## 2015-01-15 NOTE — Procedures (Signed)
DATE: 01/15/2015      REASON FOR VISIT: Principal Problem:    Lumbar disc disease with radiculopathy        PROCEDURE: Lumbar Epidural Steroid Injection.   []  Moderate Sedation    DESCRIPTION OF PROCEDURE:              After obtaining informed consent, the patient was taken to the procedure room, positioned prone, and sterilely prepped.  The procedure was performed under fluoroscopic guidance.  First 5 ml of 1% Xylocaine was used at L4-5 for local anesthesia.  A 19-gauge Hustead needle was advanced to the epidural space.  The was confirmed on the fluoroscope with an injection of [x]  2ml  []  ml Contrast.  Then, [x]  5ml of 0.25% Marcaine, [x]  4ml of Normal Saline, and [x]  80 mg of Depo Medrol was gently injected.        There were no complications.    DIAGNOSES:  [x]  Low Back Pain  [x]  *Lumbar Radiculopathy  [x]  *Lumbar Degenerative Disc Disease  []  *Lumbar Spinal Stenosis  []  *Lumbar Postlaminectomy Syndrome  []  Other    PLAN:  [x]  Will return to office in  3 month(s)for  [x]  Planned Procedure  []  Office Visit  []  Prescriptions were given today   []  No prescriptions needed today  []  Patient is to call with any questions or concerns which may arise prior to the next office visit.    COMMENTS:  The patient returns today complaining of low back pain that occassionally radiates down his left leg.     Current Medication Regimen Working? Yes  Current Issues / Falls / ER Visits: No  Percentage of Pain Relief after Last Procedure: 90 % How long lasted: 3 months  Radiology exams received during the last 12 months: No   When na [facility]   Imaging on chart: no    Imaging records requested: No  Physical therapy during the last 12 months: No   When: na Where na  Labs during the last 12 months: Yes    Today we are repeating lumbar epidural injections. Discussed the risks and complications with the patient.        Beatrix Fetters, MD    [x]  Over 50% of today's appointment was given to discussion, evaluation and counseling.

## 2015-01-15 NOTE — Discharge Instructions (Signed)
Dr. Claudie Revering  Discharge Instructions / Information        You have had the procedure(s) called:    []  Cervical Epidural     [x]  Lumbar Epidural     []  _____________Injection  []  Cervical Facets  []  Lumbar Facets []  Radiofrequency Lesioning  []  Occipital Nerve Blocks []  Caudal Epidural []  Transforaminal Epidural  []  Trigger Point Injections []  SI Joint Injection []  ______________________    Please follow these instructions carefully.      If you have questions or problems you may call [phone].     You have received the following medications:  [x]  Lidocaine [x]  Bupivicaine: []  0.50%   [x]  0.25% [x]  DepoMedrol   [x]  Normal Saline  []  Versed []  Sodium Bicarbonate   []  Isovue   []  Botox  []  Kenalog   []  Diprovan  [x] OMNIPAQ      PATIENT INFORMATION:  You may experience the following symptoms after your procedure.  These symptoms are normal and should not cause alarm.     An increase in our pain may occurr.  This may last 24-48 hours after your procedure.   No change in pain.   Weakness or numbness in your affected extremity.  This will usually only last a few hours.    REMEMBER TO REPORT THE FOLLOWING SYMPTOMS TO YOUR DOCTOR:   Redness, swelling. Or drainage at the injection site.   Unusual pain that interferes with your usual activities of daily living.    OTHER INSTRUCTIONS:  [x]  I will apply ice to the injection site for 24 hours.  Apply 15-2- minutes and take off 40 minutes.  [x]  I understand if a steroid was used it will take effect in 3-5 days.  [x]  I have received my personal belongings and valuable.  [x]  I have received a copy of my discharge instructions and understand to my satisfaction.  [x]  I will not drive for [x]  12 hours    []  24 hours after my injections.  []  I understand that today I will  []  rest  []  walk and move freely  []  other _________________  []  Additional instructions:  ____________________________________________________________________________        Patient Discharge:  [x]  Home  []   Hospital  []  Other  ___________________________________    How:  []  Ambulatory  [x]  Wheelchair   []  Stretcher   []  __________________________________    Accompanied by:  [x]  Family Member  []  [facility] Staff  []  Ambulance Staff    []  Other_____________________________

## 2015-01-19 ENCOUNTER — Encounter

## 2015-01-19 NOTE — Telephone Encounter (Signed)
Labs were done 4-16.

## 2015-01-23 MED ORDER — PRAVASTATIN SODIUM 40 MG PO TABS
40 MG | ORAL_TABLET | Freq: Every day | ORAL | Status: DC
Start: 2015-01-23 — End: 2015-07-04

## 2015-01-23 MED ORDER — ESOMEPRAZOLE MAGNESIUM 40 MG PO CPDR
40 MG | ORAL_CAPSULE | Freq: Every day | ORAL | Status: DC
Start: 2015-01-23 — End: 2015-05-09

## 2015-01-23 MED ORDER — METOPROLOL TARTRATE 25 MG PO TABS
25 MG | ORAL_TABLET | ORAL | Status: DC
Start: 2015-01-23 — End: 2016-03-04

## 2015-04-18 ENCOUNTER — Encounter: Attending: Pain Medicine | Primary: Family Medicine

## 2015-05-09 ENCOUNTER — Ambulatory Visit: Admit: 2015-05-09 | Discharge: 2015-05-09 | Payer: MEDICARE | Attending: Family | Primary: Family Medicine

## 2015-05-09 ENCOUNTER — Encounter: Attending: Pain Medicine | Primary: Family Medicine

## 2015-05-09 DIAGNOSIS — R079 Chest pain, unspecified: Secondary | ICD-10-CM

## 2015-05-09 MED ORDER — ATORVASTATIN CALCIUM 40 MG PO TABS
40 MG | ORAL_TABLET | Freq: Every day | ORAL | 0 refills | Status: DC
Start: 2015-05-09 — End: 2015-12-13

## 2015-05-09 NOTE — Patient Instructions (Addendum)
Continue current medications as prescribed.   Continue to follow up with primary care provider for medical concerns.    Call with any problems, questions or concerns.    Follow up as scheduled with cardiology.    Additional patient instructions:  Risk factors for heart disease or progression of the disease:  Cigarette use, high cholesterol, high blood pressure, obesity and diabetes.  Continue heart healthy and low sodium diet.  Exercise as tolerated.  Strive for 15 minutes of exercise most days of the week.    Blood pressure goal is 140/90 or less. If you are a diabetic, the goal is 130/80 or less.  You may be asked to keep a blood pressure log.  If so, call the office to report readings in 2 weeks at [phone].  If you are taking cholesterol lowering medications, it is recommended that lab work be checked annually.    Always keep a current medication list.  Bring your medications to every office visit.   The following educational material has been included in this after visit summary for your review: heart health and heart disease.    How to take:  NITROGLYCERIN (Nitrostat) 0.4 mg tablets, sublingual.  Nitroglycerin is in a group of drugs called nitrates. Nitroglycerin dilates (widens) blood vessels, making it easier for blood to flow through them and easier for the heart to pump.    Dosing Guidelines for Nitroglycerin Tablets   At the start of an angina (chest pain) attack, place one tablet under the tongue or between the cheek and gum.  Do not swallow or chew the tablet; let it dissolve on its own.  If necessary, a second and third tablet may be used, with five minutes between using each tablet.  If you use a third tablet and your chest pain continues, it is time to seek immediate medical attention.  Call 911 immediately and have someone drive you to the emergency room.  You may be having a heart attack or other serious heart problem.     To prevent angina from exercise or stress, use 1 tablet 5 to 10  minutes before the activity.    Stress Echocardiogram    Register at the [facility] located on the first floor of [facility].   Enter through hospital main entrance and turn immediately to your left.  Date/Time: 05/25/15 at 8:15 am please arrive at 7:45 am to register.     This records the heart's activity during a cardiac stress test.  A stress echocardiogram is a very effective, noninvasive test that can help determine whether you have blockages in your coronary arteries.    The exam takes approximately thirty minutes.      To help ensure accurate results, patients should take the following steps in preparation for a stress echocardiogram:   Refrain from strenuous activity for 12 hours before the test.   Do not eat, drink, or smoke for two hours prior to the test.  Unless instructed otherwise by your physician, you should continue to take prescribed medications.   Wear loose, comfortable clothing and walking shoes.  If you need to change this appointment, please call [phone] to reschedule.    Learning About Coronary Artery Disease (CAD)  What is coronary artery disease?     Coronary artery disease (CAD) occurs when plaque builds up in the arteries that bring oxygen-rich blood to your heart. Plaque is a fatty substance made of cholesterol, calcium, and other substances in the blood.  This process is called hardening of the arteries, or atherosclerosis.  What happens when you have coronary artery disease?   Plaque may narrow the coronary arteries. Narrowed arteries cause poor blood flow. This can lead to angina symptoms such as chest pain or discomfort. If blood flow is completely blocked, you could have a heart attack.   You can slow CAD and reduce the risk of future problems by making changes in your lifestyle. These include quitting smoking and eating heart-healthy foods.   Treatments for CAD, along with changes in your lifestyle, can help you live a longer and  healthier life.  How can you prevent coronary artery disease?   Do not smoke. It may be the best thing you can do to prevent heart disease. If you need help quitting, talk to your doctor about stop-smoking programs and medicines. These can increase your chances of quitting for good.   Be active. Get at least 30 minutes of exercise on most days of the week. Walking is a good choice. You also may want to do other activities, such as running, swimming, cycling, or playing tennis or team sports.   Eat heart-healthy foods. Eat more fruits and vegetables and less foods that contain saturated and trans fats. Limit alcohol, sodium, and sweets.   Stay at a healthy weight. Lose weight if you need to.   Manage other health problems such as diabetes, high blood pressure, and high cholesterol.   Manage stress. Stress can hurt your heart. To keep stress low, talk about your problems and feelings. Don't keep your feelings hidden.   If you have talked about it with your doctor, take a low-dose aspirin every day. Aspirin can help certain people lower their risk of a heart attack or stroke. But taking aspirin isn't right for everyone, because it can cause serious bleeding. Do not start taking daily aspirin unless your doctor knows about it.  How is coronary artery disease treated?   Your doctor will suggest that you make lifestyle changes. For example, your doctor may ask you to eat healthy foods, quit smoking, lose extra weight, and be more active.   You will have to take medicines.   Your doctor may suggest a procedure to open narrowed or blocked arteries. This is called angioplasty. Or your doctor may suggest using healthy blood vessels to create detours around narrowed or blocked arteries. This is called bypass surgery.  Follow-up care is a key part of your treatment and safety. Be sure to make and go to all appointments, and call your doctor if you are having problems. It's also a good idea to know your test results  and keep a list of the medicines you take.  Where can you learn more?  Go to [email or web address] and sign in to your MyChart account. Enter C643 in the Search Health Information box to learn more about "Learning About Coronary Artery Disease (CAD)."    If you do not have an account, please click on the "Sign Up Now" link.   2006-2016 [employer]. Care instructions adapted under license by [facility]. This care instruction is for use with your licensed healthcare professional. If you have questions about a medical condition or this instruction, always ask your healthcare professional. [employer] disclaims any warranty or liability for your use of this information.  Content Version: 10.9.538570; Current as of: September 20, 2014    A Healthy Heart: Care Instructions  Your Care Instructions     Heart disease occurs when a  substance called plaque builds up in the vessels that supply oxygen-rich blood to your heart. This can narrow the blood vessels and reduce blood flow. A heart attack happens when blood flow is completely blocked. A high-fat diet, smoking, and other factors increase the risk of heart disease.  Your doctor has found that you have a chance of having heart disease. You can do lots of things to keep your heart healthy. It may not be easy, but you can change your diet, exercise more, and quit smoking. These steps really work to lower your chance of heart disease.  Follow-up care is a key part of your treatment and safety. Be sure to make and go to all appointments, and call your doctor if you are having problems. It's also a good idea to know your test results and keep a list of the medicines you take.  How can you care for yourself at home?  Diet   Use less salt when you cook and eat. This helps lower your blood pressure. Taste food before salting. Add only a little salt when you think you need it. With time, your taste buds will adjust to less salt.   Eat  fewer snack items, fast foods, canned soups, and other high-salt, high-fat, processed foods.   Read food labels and try to avoid saturated and trans fats. They increase your risk of heart disease by raising cholesterol levels.   Limit the amount of solid fat-butter, margarine, and shortening-you eat. Use olive, peanut, or canola oil when you cook. Bake, broil, and steam foods instead of frying them.   Eating fish can lower your risk for heart disease. Eat at least 2 servings of fish a week. Salmon, mackerel, herring, sardines, and chunk light tuna are very good choices. These fish contain omega-3 fatty acids.   Eat a variety of fruit and vegetables every day. Dark green, deep orange, red, or yellow fruits and vegetables are especially good for you. Examples include spinach, carrots, peaches, and berries.   Foods high in fiber can reduce your cholesterol and provide important vitamins and minerals. High-fiber foods include whole-grain cereals and breads, oatmeal, beans, brown rice, citrus fruits, and apples.   Limit drinks and foods with added sugar. These include candy, desserts, and soda pop.  Lifestyle changes   If your doctor recommends it, get more exercise. Walking is a good choice. Bit by bit, increase the amount you walk every day. Try for at least 30 minutes on most days of the week. You also may want to swim, bike, or do other activities.   Do not smoke. If you need help quitting, talk to your doctor about stop-smoking programs and medicines. These can increase your chances of quitting for good. Quitting smoking may be the most important step you can take to protect your heart. It is never too late to quit. You will get health benefits right away.   Limit alcohol to 2 drinks a day for men and 1 drink a day for women. Too much alcohol can cause health problems.  Medicines   Take your medicines exactly as prescribed. Call your doctor if you think you are having a problem with your medicine.   If  your doctor recommends aspirin, take the amount directed each day. Make sure you take aspirin and not another kind of pain reliever, such as acetaminophen (Tylenol). If you take ibuprofen (such as Advil or Motrin) for other problems, take aspirin at least 2 hours before taking ibuprofen.  When  should you call for help?  Call 911 if you have symptoms of a heart attack. These may include:   You have chest pain or pressure. This may occur with:   Chest pain or pressure, or a strange feeling in the chest.   Sweating.   Shortness of breath.   Pain, pressure, or a strange feeling in the back, neck, jaw, or upper belly or in one or both shoulders or arms.   Lightheadedness or sudden weakness.   A fast or irregular heartbeat.  After you call 911, the operator may tell you to chew 1 adult-strength or 2 to 4 low-dose aspirin. Wait for an ambulance. Do not try to drive yourself.  Watch closely for changes in your health, and be sure to contact your doctor if:   Your symptoms are slowly getting worse.   You do not get better as expected.   Where can you learn more?   Go to [email or web address] and sign in to your MyChart account. Enter [phone] in the Search Health Information box to learn more about "A Healthy Heart: Care Instructions."    If you do not have an account, please click on the "Sign Up Now" link.    2006-2016 [employer]. Care instructions adapted under license by [facility]. This care instruction is for use with your licensed healthcare professional. If you have questions about a medical condition or this instruction, always ask your healthcare professional. [employer] disclaims any warranty or liability for your use of this information.  Content Version: 10.8.513193; Current as of: October 14, 2013

## 2015-05-09 NOTE — Progress Notes (Signed)
[facility], [place].  [facility]  [address], [address], [place]  [address]  [phone] office  [phone] fax      OFFICE VISIT:  05/09/2015    Richard Sims - DOB: 07/29/1951    Reason For Visit:  Richard Sims is a 64 y.o. male who is here for 6 Month Follow-Up - Patient states he has had L arm pain and some discomfort in chest, and SOA, and some nausea while in [place]. and Chest Pain    The patient presents today for cardiology follow up.  The patient just returned from [place] trip.  He report doing a great deal of walking there.  At times, he noticed DOE with left arm and chest "feeling funny".  The patient's PCP monitors cholesterol.  The patient is s/p CABG  x 1 with LIMA to LAD on 06/02/11.  He also has DES to RCA.    Subjective  Richard Sims denies orthopnea, paroxysmal nocturnal dyspnea, syncope, presyncope, sustained arrythmia, edema and fatigue.  The patient denies numbness or weakness to suggest cerebrovascular accident or transient ischemic attack.  + DOE.  + chest and left arm discomfort with exertion.  Richard Sims has the following history as recorded in EpicCare:    Patient Active Problem List   Diagnosis Code   . CAD (coronary artery disease) I25.10   . Hyperlipidemia E78.5   . Hypertension I10   . S/P CABG x 1 Z95.1   . Chest pain R07.9   . Shortness of breath R06.02   . Lumbar degenerative disc disease M51.36   . Lumbar radiculopathy M54.16   . Low back pain radiating to left leg M54.5   . Lumbar disc disease with radiculopathy M51.16   . H/O right coronary artery stent placement Z95.5     Past Medical History   Diagnosis Date   . CAD (coronary artery disease)    . Cervical disc disease    . GERD (gastroesophageal reflux disease)    . Hyperlipidemia      [facility] manage his lipid/liver panel   . Hypertension    . Low back pain radiating to left leg 09/11/2014   . Lumbar degenerative disc disease 08/11/2014   . Lumbar  radiculopathy 08/11/2014   . Nephrolithiasis    . S/P CABG x 1      MICS with LIMA to LAD via left mini thoracotomy     Past Surgical History   Procedure Laterality Date   . Cardiac catheterization  05/29/11   [facility]     EF  over 60%   . Coronary artery bypass graft  06/02/2011      HYBRID MICS CABG, LT THORACOTOMY APPROACH, LIMA-LAD, PCI DES-RCA   . Joint replacement       LEFT KNEE   . Spine surgery       ACF X 2   . Knee arthroscopy       BILATERAL   . Cardiac catheterization  12/12/2011  [facility]     with PTCA, stent to LAD     Family History   Problem Relation Age of Onset   . High Blood Pressure Mother    . Heart Disease Father    . High Blood Pressure Father    . High Cholesterol Father    . Depression Paternal Grandmother      Social History   Substance Use Topics   . Smoking status: Never Smoker   .  Smokeless tobacco: Never Used   . Alcohol use Yes      Comment: Occassional      Current Outpatient Prescriptions   Medication Sig Dispense Refill   . esomeprazole Magnesium (NEXIUM) 40 MG PACK Take 40 mg by mouth as needed     . atorvastatin (LIPITOR) 40 MG tablet Take 1 tablet by mouth daily 90 tablet 0   . metoprolol (LOPRESSOR) 25 MG tablet Take one-half tablet by mouth twice daily 90 tablet 3   . pravastatin (PRAVACHOL) 40 MG tablet Take 1 tablet by mouth daily 90 tablet 1   . tiZANidine (ZANAFLEX) 4 MG tablet Take 1 tablet by mouth 2 times daily as needed (one month supply) 45 tablet 2   . RAPAFLO 8 MG CAPS 8 mg every evening      . celecoxib (CELEBREX) 200 MG capsule Take 1 capsule by mouth  twice a day (Patient taking differently: Take 1 capsule by mouth  twice a day or prn) 180 capsule 1   . nitroGLYCERIN (NITROSTAT) 0.4 MG SL tablet Place 1 tablet under the tongue every 5 minutes as needed for Chest pain up to 3 doses, in no relief report to the ER. 25 tablet 3   . dutasteride (AVODART) 0.5 MG capsule Take 0.5 mg by mouth daily.     Marland Kitchen aspirin 81 MG EC tablet Take 81 mg by mouth daily.         No current  facility-administered medications for this visit.      Allergies: Crestor [rosuvastatin] and Simvastatin    Review of Systems  Constitutional - no appetite change, or unexpected weight change. No fever, chills or diaphoresis.  No significant change in activity level or new onset of fatigue.   HEENT - no significant rhinorrhea or epistaxis. No tinnitus or significant hearing loss.   Eyes - no sudden vision change or amaurosis. No corneal arcus, xantholasma, subconjunctival hemorrhage or discharge.  Respiratory - no significant wheezing, stridor, apnea or cough.  + DOE.  Cardiovascular - no orthopnea or PND.  No sensation of sustained arrythmia.  No occurrence of slow heart rate.  No palpitations.  No claudication.  No leg edema. + chest and left arm pain with exertion.  Gastrointestinal - no abdominal swelling or pain. No blood in stool. No severe constipation, diarrhea, nausea, or vomiting.   Genitourinary - no dysuria, frequency, or urgency. No flank pain or hematuria.   Musculoskeletal - no back pain or myalgia.  No problems with gait.  Extremities - no clubbing, cyanosis or edema.  Skin - no color change or rash.  No pallor.  No new surgical incision.   Neurologic - no speech difficulty, facial asymmetry or lateralizing weakness.  No seizures, presyncope or syncope.  No significant dizziness.  Hematologic - no easy bruising or excessive bleeding.   Psychiatric - no severe anxiety or insomnia.  No confusion.   All other review of systems are negative.    Objective  Vital Signs -   Visit Vitals   . BP 112/78   . Pulse 62   . Ht 5\' 7"  (1.702 m)   . Wt 190 lb (86.2 kg)   . BMI 29.76 kg/m2     General - Richard Sims is alert, cooperative, and pleasant.  Well groomed.  No acute distress.    Body habitus - Body mass index is 29.76 kg/(m^2).  HEENT - Head is normocephalic. No circumoral cyanosis.  Dentition is normal.  EYES -   Lids  normal without ptosis.  No discharge, edema or subconjunctival hemorrhage.   Neck - Symmetrical  without apparent mass or lymphadenopathy.   Respiratory - Normal respiratory effort without use of accessory muscles.  Ausculatation reveals vesicular breath sounds without crackles, wheezes, rub or rhonchi.    Cardiovascular - No jugular venous distention.  Auscultation reveals regular rate and rhythm.  No audible clicks, gallop or rub.  No murmur.  No lower extremity varicosities.  No carotid bruits.    Abdominal -  No visible distention, mass or pulsations.  Extremities - No clubbing or cyanosis.  No statis dermatitis or ulcers. No edema.    Musculoskeletal -   No Osler's nodes.  No kyphosis or scoliosis.  Gait is even and regular without limp or shuffle. Ambulates without assistance.  Skin -  Warm and dry; no rash or pallor.   No new surgical wound.  Neurological - No focal neurological deficits.  Thought processes coherent.  No apparent tremor.   Oriented to person, place and time.    Psychiatric -  Appropriate affect and mood.     Assessment:    1. Chest pain, unspecified type  Echocardiogram stress test   2. Mixed hyperlipidemia     3. Essential hypertension     4. S/P CABG x 1  Echocardiogram stress test    06/02/11 LIMA to LAD   5. H/O right coronary artery stent placement         BP Readings from Last 3 Encounters:   05/09/15 112/78   01/15/15 127/70   11/06/14 116/74    Pulse Readings from Last 3 Encounters:   05/09/15 62   01/15/15 68   11/06/14 64        Wt Readings from Last 3 Encounters:   05/09/15 190 lb (86.2 kg)   01/15/15 190 lb (86.2 kg)   11/06/14 188 lb (85.3 kg)     Plan  Previous cardiac history and records reviewed.  Stress echo scheduled.  Continue current medications as prescribed.   Continue to follow up with primary care provider for medical concerns.    Call with any problems, questions or concerns.    Follow up as scheduled with cardiology.    Additional patient instructions:  Risk factors for heart disease or progression of the disease:  Cigarette use, high cholesterol, high blood  pressure, obesity and diabetes.  Continue heart healthy and low sodium diet.  Exercise as tolerated.  Strive for 15 minutes of exercise most days of the week.    Blood pressure goal is 140/90 or less. If you are a diabetic, the goal is 130/80 or less.  You may be asked to keep a blood pressure log.  If so, call the office to report readings in 2 weeks at [phone].  If you are taking cholesterol lowering medications, it is recommended that lab work be checked annually.    Always keep a current medication list.  Bring your medications to every office visit.   The following educational material has been included in this after visit summary for your review: heart health and heart disease.    How to take:  NITROGLYCERIN (Nitrostat) 0.4 mg tablets, sublingual.  Nitroglycerin is in a group of drugs called nitrates. Nitroglycerin dilates (widens) blood vessels, making it easier for blood to flow through them and easier for the heart to pump.    Dosing Guidelines for Nitroglycerin Tablets   At the start of an angina (chest pain) attack, place one tablet under  the tongue or between the cheek and gum.  Do not swallow or chew the tablet; let it dissolve on its own.  If necessary, a second and third tablet may be used, with five minutes between using each tablet.  If you use a third tablet and your chest pain continues, it is time to seek immediate medical attention.  Call 911 immediately and have someone drive you to the emergency room.  You may be having a heart attack or other serious heart problem.     To prevent angina from exercise or stress, use 1 tablet 5 to 10 minutes before the activity.    Stress Echocardiogram    Register at the [facility] located on the first floor of [facility].   Enter through hospital main entrance and turn immediately to your left.  Date/Time:    This records the heart's activity during a cardiac stress test.  A stress echocardiogram is a very  effective, noninvasive test that can help determine whether you have blockages in your coronary arteries.    The exam takes approximately thirty minutes.      To help ensure accurate results, patients should take the following steps in preparation for a stress echocardiogram:   Refrain from strenuous activity for 12 hours before the test.   Do not eat, drink, or smoke for two hours prior to the test.  Unless instructed otherwise by your physician, you should continue to take prescribed medications.   Wear loose, comfortable clothing and walking shoes.  If you need to change this appointment, please call [phone] to reschedule.    Learning About Coronary Artery Disease (CAD)  What is coronary artery disease?     Coronary artery disease (CAD) occurs when plaque builds up in the arteries that bring oxygen-rich blood to your heart. Plaque is a fatty substance made of cholesterol, calcium, and other substances in the blood. This process is called hardening of the arteries, or atherosclerosis.  What happens when you have coronary artery disease?   Plaque may narrow the coronary arteries. Narrowed arteries cause poor blood flow. This can lead to angina symptoms such as chest pain or discomfort. If blood flow is completely blocked, you could have a heart attack.   You can slow CAD and reduce the risk of future problems by making changes in your lifestyle. These include quitting smoking and eating heart-healthy foods.   Treatments for CAD, along with changes in your lifestyle, can help you live a longer and healthier life.  How can you prevent coronary artery disease?   Do not smoke. It may be the best thing you can do to prevent heart disease. If you need help quitting, talk to your doctor about stop-smoking programs and medicines. These can increase your chances of quitting for good.   Be active. Get at least 30 minutes of exercise on most days of the week. Walking is a good choice. You also may want to do other  activities, such as running, swimming, cycling, or playing tennis or team sports.   Eat heart-healthy foods. Eat more fruits and vegetables and less foods that contain saturated and trans fats. Limit alcohol, sodium, and sweets.   Stay at a healthy weight. Lose weight if you need to.   Manage other health problems such as diabetes, high blood pressure, and high cholesterol.   Manage stress. Stress can hurt your heart. To keep stress low, talk about your problems and feelings. Don't keep your feelings  hidden.   If you have talked about it with your doctor, take a low-dose aspirin every day. Aspirin can help certain people lower their risk of a heart attack or stroke. But taking aspirin isn't right for everyone, because it can cause serious bleeding. Do not start taking daily aspirin unless your doctor knows about it.  How is coronary artery disease treated?   Your doctor will suggest that you make lifestyle changes. For example, your doctor may ask you to eat healthy foods, quit smoking, lose extra weight, and be more active.   You will have to take medicines.   Your doctor may suggest a procedure to open narrowed or blocked arteries. This is called angioplasty. Or your doctor may suggest using healthy blood vessels to create detours around narrowed or blocked arteries. This is called bypass surgery.  Follow-up care is a key part of your treatment and safety. Be sure to make and go to all appointments, and call your doctor if you are having problems. It's also a good idea to know your test results and keep a list of the medicines you take.  Where can you learn more?  Go to [email or web address] and sign in to your MyChart account. Enter C643 in the Search Health Information box to learn more about "Learning About Coronary Artery Disease (CAD)."    If you do not have an account, please click on the "Sign Up Now" link.   2006-2016 [employer], [employer]. Care instructions adapted under  license by [facility]. This care instruction is for use with your licensed healthcare professional. If you have questions about a medical condition or this instruction, always ask your healthcare professional. Healthwise, Incorporated disclaims any warranty or liability for your use of this information.  Content Version: 10.9.538570; Current as of: September 20, 2014    A Healthy Heart: Care Instructions  Your Care Instructions     Heart disease occurs when a substance called plaque builds up in the vessels that supply oxygen-rich blood to your heart. This can narrow the blood vessels and reduce blood flow. A heart attack happens when blood flow is completely blocked. A high-fat diet, smoking, and other factors increase the risk of heart disease.  Your doctor has found that you have a chance of having heart disease. You can do lots of things to keep your heart healthy. It may not be easy, but you can change your diet, exercise more, and quit smoking. These steps really work to lower your chance of heart disease.  Follow-up care is a key part of your treatment and safety. Be sure to make and go to all appointments, and call your doctor if you are having problems. It's also a good idea to know your test results and keep a list of the medicines you take.  How can you care for yourself at home?  Diet   Use less salt when you cook and eat. This helps lower your blood pressure. Taste food before salting. Add only a little salt when you think you need it. With time, your taste buds will adjust to less salt.   Eat fewer snack items, fast foods, canned soups, and other high-salt, high-fat, processed foods.   Read food labels and try to avoid saturated and trans fats. They increase your risk of heart disease by raising cholesterol levels.   Limit the amount of solid fat-butter, margarine, and shortening-you eat. Use olive, peanut, or canola oil when you cook. Bake, broil, and steam foods instead of frying  them.   Eating  fish can lower your risk for heart disease. Eat at least 2 servings of fish a week. Salmon, mackerel, herring, sardines, and chunk light tuna are very good choices. These fish contain omega-3 fatty acids.   Eat a variety of fruit and vegetables every day. Dark green, deep orange, red, or yellow fruits and vegetables are especially good for you. Examples include spinach, carrots, peaches, and berries.   Foods high in fiber can reduce your cholesterol and provide important vitamins and minerals. High-fiber foods include whole-grain cereals and breads, oatmeal, beans, brown rice, citrus fruits, and apples.   Limit drinks and foods with added sugar. These include candy, desserts, and soda pop.  Lifestyle changes   If your doctor recommends it, get more exercise. Walking is a good choice. Bit by bit, increase the amount you walk every day. Try for at least 30 minutes on most days of the week. You also may want to swim, bike, or do other activities.   Do not smoke. If you need help quitting, talk to your doctor about stop-smoking programs and medicines. These can increase your chances of quitting for good. Quitting smoking may be the most important step you can take to protect your heart. It is never too late to quit. You will get health benefits right away.   Limit alcohol to 2 drinks a day for men and 1 drink a day for women. Too much alcohol can cause health problems.  Medicines   Take your medicines exactly as prescribed. Call your doctor if you think you are having a problem with your medicine.   If your doctor recommends aspirin, take the amount directed each day. Make sure you take aspirin and not another kind of pain reliever, such as acetaminophen (Tylenol). If you take ibuprofen (such as Advil or Motrin) for other problems, take aspirin at least 2 hours before taking ibuprofen.  When should you call for help?  Call 911 if you have symptoms of a heart attack. These may include:   You have chest pain or  pressure. This may occur with:   Chest pain or pressure, or a strange feeling in the chest.   Sweating.   Shortness of breath.   Pain, pressure, or a strange feeling in the back, neck, jaw, or upper belly or in one or both shoulders or arms.   Lightheadedness or sudden weakness.   A fast or irregular heartbeat.  After you call 911, the operator may tell you to chew 1 adult-strength or 2 to 4 low-dose aspirin. Wait for an ambulance. Do not try to drive yourself.  Watch closely for changes in your health, and be sure to contact your doctor if:   Your symptoms are slowly getting worse.   You do not get better as expected.   Where can you learn more?   Go to [email or web address] and sign in to your MyChart account. Enter [phone] in the Search Health Information box to learn more about "A Healthy Heart: Care Instructions."    If you do not have an account, please click on the "Sign Up Now" link.    2006-2016 [employer]wise, [employer]. Care instructions adapted under license by [facility]. This care instruction is for use with your licensed healthcare professional. If you have questions about a medical condition or this instruction, always ask your healthcare professional. Healthwise, Inc[employer] disclaims any warranty or liability for your use of this information.  Content Version: 10.8.513193; Current as of: October 14, 2013  Aloha Gell, APRN

## 2015-05-16 ENCOUNTER — Encounter

## 2015-05-16 ENCOUNTER — Inpatient Hospital Stay: Admit: 2015-05-16 | Discharge: 2015-05-17 | Payer: MEDICARE | Attending: Pain Medicine | Primary: Family Medicine

## 2015-05-16 ENCOUNTER — Inpatient Hospital Stay: Admit: 2015-05-16 | Discharge: 2015-05-17 | Payer: MEDICARE | Primary: Family Medicine

## 2015-05-16 DIAGNOSIS — M5136 Other intervertebral disc degeneration, lumbar region: Secondary | ICD-10-CM

## 2015-05-16 DIAGNOSIS — M5116 Intervertebral disc disorders with radiculopathy, lumbar region: Secondary | ICD-10-CM

## 2015-05-16 MED ORDER — SODIUM CHLORIDE 0.9 % IJ SOLN
0.9 % | Freq: Once | INTRAMUSCULAR | Status: AC | PRN
Start: 2015-05-16 — End: 2015-05-16
  Administered 2015-05-16: 21:00:00 4

## 2015-05-16 MED ORDER — LIDOCAINE-EPINEPHRINE 1 %-1:200000 IJ SOLN
1 percent-:200000 | Freq: Once | INTRAMUSCULAR | Status: AC | PRN
Start: 2015-05-16 — End: 2015-05-16
  Administered 2015-05-16: 21:00:00 2 via EPIDURAL

## 2015-05-16 MED ORDER — SODIUM CHLORIDE 0.9 % IJ SOLN
0.9 % | INTRAMUSCULAR | Status: AC
Start: 2015-05-16 — End: ?

## 2015-05-16 MED ORDER — BUPIVACAINE HCL (PF) 0.25 % IJ SOLN
0.25 % | INTRAMUSCULAR | Status: AC
Start: 2015-05-16 — End: ?

## 2015-05-16 MED ORDER — BUPIVACAINE HCL (PF) 0.25 % IJ SOLN
0.25 % | Freq: Once | INTRAMUSCULAR | Status: AC | PRN
Start: 2015-05-16 — End: 2015-05-16
  Administered 2015-05-16: 21:00:00 5 via EPIDURAL

## 2015-05-16 MED ORDER — IOHEXOL 240 MG/ML IJ SOLN
240 MG/ML | Freq: Once | INTRAMUSCULAR | Status: AC | PRN
Start: 2015-05-16 — End: 2015-05-16
  Administered 2015-05-16: 21:00:00 2 via EPIDURAL

## 2015-05-16 MED ORDER — METHYLPREDNISOLONE ACETATE 80 MG/ML IJ SUSP
80 MG/ML | Freq: Once | INTRAMUSCULAR | Status: AC | PRN
Start: 2015-05-16 — End: 2015-05-16
  Administered 2015-05-16: 21:00:00 80 via EPIDURAL

## 2015-05-16 MED ORDER — METHYLPREDNISOLONE ACETATE 80 MG/ML IJ SUSP
80 MG/ML | INTRAMUSCULAR | Status: AC
Start: 2015-05-16 — End: ?

## 2015-05-16 MED ORDER — LIDOCAINE HCL (PF) 1 % IJ SOLN
1 % | Freq: Once | INTRAMUSCULAR | Status: AC | PRN
Start: 2015-05-16 — End: 2015-05-16
  Administered 2015-05-16: 21:00:00 3 via SUBCUTANEOUS

## 2015-05-16 MED ORDER — CYCLOBENZAPRINE HCL 10 MG PO TABS
10 MG | ORAL_TABLET | Freq: Three times a day (TID) | ORAL | 2 refills | Status: DC | PRN
Start: 2015-05-16 — End: 2016-01-15

## 2015-05-16 MED FILL — DEPO-MEDROL 80 MG/ML IJ SUSP: 80 MG/ML | INTRAMUSCULAR | Qty: 1

## 2015-05-16 MED FILL — SENSORCAINE-MPF 0.25 % IJ SOLN: 0.25 % | INTRAMUSCULAR | Qty: 10

## 2015-05-16 MED FILL — SODIUM CHLORIDE 0.9 % IJ SOLN: 0.9 % | INTRAMUSCULAR | Qty: 10

## 2015-05-16 NOTE — Progress Notes (Signed)
Procedure:  Level of Consciousness: [x]Alert [x]Oriented []Disoriented []Lethargic  Anxiety Level: [x]Calm []Anxious []Depressed []Other  Skin: [x]Warm [x]Dry []Cool []Moist []Intact []Other  Cardiovascular: []Palpitations: [x]Never []Occasionally []Frequently  Chest Pain: [x]No []Yes  Respiratory:  [x]Unlabored []Labored []Cough ([] Productive []Unproductive)  HCG Required: [x]No []Yes   Results: []Negative []Positive  Knowledge Level:        [x]Patient/Other verbalized understanding of pre-procedure instructions.        [x]Assessment of post-op care needs (transportation, responsible caregiver)        [x]Able to discuss health care problems and how to deal with it.  Factors that Affect Teaching:        Language Barrier: [x]No []Yes - why:        Hearing Loss:        [x]No []Yes            Corrective Device:  []Yes []No        Vision Loss:           []No [x]Yes            Corrective Device:  [x]Yes []No        Memory Loss:       [x]No []Yes            []Short Term []Long Term  Motivational Level:  [x]Asks Questions                  []Extremely Anxious       [x]Seems Interested               []Seems Uninterested                  [x]Denies need for Education  Risk for Injury:  []Patient oriented to person, place and time  []History of frequent falls/loss of balance  Nutritional:  []Change in appetite   []Weight Gain   []Weight Loss  Functional:  []Requires assistance with ADL's

## 2015-05-16 NOTE — Progress Notes (Signed)
Nursing Admission Record    Current Medication Regimen Working?  Yes    Current Issues / Falls / ER Visits:  No    Percentage of Pain Relief after Last Procedure:  75 %    How long lasted:  3 months    Radiology exams received during the last 12 months: No       When na                                              Where na       Imaging on chart: No         Imaging records requested: No  Physical therapy during the last 12 months: No       When: na                                             Where na  Labs during the last 12 months: Yes    Education Provided:  [x]  Review of [place]  []  Agreement Review  []  Compliance Issues Discussed    []  Cognitive Behavior Needs [x]  Exercise []  Review of Test []  Financial Issues  []  Tobacco/Alcohol Use [x]  Teaching []  New Patient []  Picture Obtained    Physician Plan:  []  Outgoing Referral  []  Pharmacy Consult  []  Test Ordered   []  Obtained Test Results / Consult Notes  []  UDS due at next visit, verified per EPIC      []  Suspected Physical Abuse or Suicide Risk assessed - IF YES COMPLETE QUESTIONS BELOW    If any of the following questions are answered yes - contact attending physician for referral:    Has been considering harming self to escape stress, pain problems?  []  YES  [x]  NO  Has a suicide plan? []  YES  [x]  NO  Has attempted suicide in the past?   []  YES  [x]  NO  Has a close friend or family member who committed suicide?  []  YES  [x]  NO    Patient Referred To :     Additional Notes:    Assessment Completed by:  Electronically signed by Laneta Simmers, RN on 05/16/2015 at 4:16 PM

## 2015-05-16 NOTE — Procedures (Signed)
DATE: 05/16/2015      REASON FOR VISIT: Principal Problem:    Lumbar disc disease with radiculopathy        PROCEDURE: Lumbar Epidural Steroid Injection.   []  Moderate Sedation    DESCRIPTION OF PROCEDURE:              After obtaining informed consent, the patient was taken to the procedure room, positioned prone, and sterilely prepped.  The procedure was performed under fluoroscopic guidance.  First 5 ml of 1% Xylocaine was used at L5 - S1 for local anesthesia.  A 19-gauge Hustead needle was advanced to the epidural space.  The was confirmed on the fluoroscope with an injection of [x]  2ml  []  ml Contrast.  Then, [x]  5ml of 0.25% Marcaine, [x]  4ml of Normal Saline, and [x]  80 mg of Depo Medrol was gently injected.        There were no complications.    DIAGNOSES:  []  Low Back Pain  [x]  *Lumbar Radiculopathy  [x]  *Lumbar Degenerative Disc Disease  []  *Lumbar Spinal Stenosis  []  *Lumbar Postlaminectomy Syndrome  []  Other    PLAN:  [x]  Will return to office in  3 month(s)for  [x]  Planned Procedure  []  Office Visit  [x]  Prescriptions were given today   []  No prescriptions needed today  [x]  Patient is to call with any questions or concerns which may arise prior to the next office visit.    COMMENTS:    Pt c/o low back pain that radiates down the left leg.  States he does have any numbness noted.  Pain score today 3/10.  Will continue with the lumbar epidural today.  Discussed risks and complications with the patient.    Overall, he still feels very much improved. To refocus on his exercising.    Current Medication Regimen Working? Yes    Current Issues / Falls / ER Visits: No    Percentage of Pain Relief after Last Procedure: 75 % How long lasted: 3 months    Radiology exams received during the last 12 months: No  When na Where na  Imaging on chart: No   Imaging records requested: No  Physical therapy during the last 12 months: No  When: na Where na  Labs during the last 12 months: Yes    Beatrix Fetters,  MD                []  Over 50% of today's appointment was given to discussion, evaluation and counseling.

## 2015-05-16 NOTE — Discharge Instructions (Signed)
Dr. Claudie Revering  Discharge Instructions / Information        You have had the procedure(s) called:    []  Cervical Epidural     [x]  Lumbar Epidural     []  _____________Injection  []  Cervical Facets  []  Lumbar Facets []  Radiofrequency Lesioning  []  Occipital Nerve Blocks []  Caudal Epidural []  Transforaminal Epidural  []  Trigger Point Injections []  SI Joint Injection []  ______________________    Please follow these instructions carefully.      If you have questions or problems you may call [phone].     You have received the following medications:  [x]  Lidocaine []  Bupivicaine: []  0.50%   []  0.25% []  DepoMedrol   [x]  Normal Saline  []  Versed []  Sodium Bicarbonate   []  Isovue   []  Botox  []  Kenalog   []  Diprovan  [x]  omnipaque_____________________    PATIENT INFORMATION:  You may experience the following symptoms after your procedure.  These symptoms are normal and should not cause alarm.     An increase in our pain may occurr.  This may last 24-48 hours after your procedure.   No change in pain.   Weakness or numbness in your affected extremity.  This will usually only last a few hours.    REMEMBER TO REPORT THE FOLLOWING SYMPTOMS TO YOUR DOCTOR:   Redness, swelling. Or drainage at the injection site.   Unusual pain that interferes with your usual activities of daily living.    OTHER INSTRUCTIONS:  [x]  I will apply ice to the injection site for 24 hours.  Apply 15-2- minutes and take off 40 minutes.  [x]  I understand if a steroid was used it will take effect in 3-5 days.  [x]  I have received my personal belongings and valuable.  [x]  I have received a copy of my discharge instructions and understand to my satisfaction.  [x]  I will not drive for [x]  12 hours    []  24 hours after my injections.  [x]  I understand that today I will  [x]  rest  []  walk and move freely  []  other _________________  []  Additional instructions:  ____________________________________________________________________________        Patient  Discharge:  [x]  Home  []  Hospital  []  Other  ___________________________________    How:  []  Ambulatory  [x]  Wheelchair   []  Stretcher   []  __________________________________    Accompanied by:  []  Family Member  []  [facility] Staff  []  Ambulance Staff    []  Other_____________________________

## 2015-05-25 ENCOUNTER — Inpatient Hospital Stay: Admit: 2015-05-25 | Payer: MEDICARE | Primary: Family Medicine

## 2015-05-25 DIAGNOSIS — R079 Chest pain, unspecified: Secondary | ICD-10-CM

## 2015-05-25 LAB — ECHOCARDIOGRAM EXERCISE STRESS TEST: Left Ventricular Ejection Fraction: 50

## 2015-07-04 ENCOUNTER — Encounter

## 2015-07-04 MED ORDER — PRAVASTATIN SODIUM 40 MG PO TABS
40 MG | ORAL_TABLET | ORAL | 1 refills | Status: DC
Start: 2015-07-04 — End: 2015-11-12

## 2015-08-15 ENCOUNTER — Encounter

## 2015-08-15 ENCOUNTER — Inpatient Hospital Stay: Admit: 2015-08-15 | Discharge: 2015-08-16 | Payer: MEDICARE | Attending: Pain Medicine | Primary: Family Medicine

## 2015-08-15 ENCOUNTER — Inpatient Hospital Stay: Admit: 2015-08-15 | Discharge: 2015-08-16 | Payer: MEDICARE | Primary: Family Medicine

## 2015-08-15 DIAGNOSIS — M5136 Other intervertebral disc degeneration, lumbar region: Secondary | ICD-10-CM

## 2015-08-15 MED ORDER — IOHEXOL 240 MG/ML IJ SOLN
240 MG/ML | Freq: Once | INTRAMUSCULAR | Status: AC | PRN
Start: 2015-08-15 — End: 2015-08-15
  Administered 2015-08-15: 22:00:00 5

## 2015-08-15 MED ORDER — LIDOCAINE-EPINEPHRINE 1 %-1:200000 IJ SOLN
1 percent-:200000 | Freq: Once | INTRAMUSCULAR | Status: AC | PRN
Start: 2015-08-15 — End: 2015-08-15
  Administered 2015-08-15: 22:00:00 3 via INTRADERMAL

## 2015-08-15 MED ORDER — BUPIVACAINE HCL (PF) 0.25 % IJ SOLN
0.25 % | Freq: Once | INTRAMUSCULAR | Status: AC | PRN
Start: 2015-08-15 — End: 2015-08-15
  Administered 2015-08-15: 22:00:00 5 via EPIDURAL

## 2015-08-15 MED ORDER — BUPIVACAINE HCL (PF) 0.25 % IJ SOLN
0.25 % | INTRAMUSCULAR | Status: AC
Start: 2015-08-15 — End: ?

## 2015-08-15 MED ORDER — SODIUM CHLORIDE 0.9 % IJ SOLN
0.9 % | INTRAMUSCULAR | Status: AC
Start: 2015-08-15 — End: ?

## 2015-08-15 MED ORDER — CIPROFLOXACIN HCL 500 MG PO TABS
500 MG | ORAL_TABLET | Freq: Two times a day (BID) | ORAL | 0 refills | Status: DC
Start: 2015-08-15 — End: 2015-08-16

## 2015-08-15 MED ORDER — BUPIVACAINE HCL (PF) 0.5 % IJ SOLN
0.5 % | INTRAMUSCULAR | Status: AC
Start: 2015-08-15 — End: ?

## 2015-08-15 MED ORDER — SODIUM CHLORIDE 0.9 % IJ SOLN
0.9 % | Freq: Once | INTRAMUSCULAR | Status: AC | PRN
Start: 2015-08-15 — End: 2015-08-15
  Administered 2015-08-15: 22:00:00 4

## 2015-08-15 MED ORDER — METHYLPREDNISOLONE ACETATE 80 MG/ML IJ SUSP
80 MG/ML | INTRAMUSCULAR | Status: AC
Start: 2015-08-15 — End: ?

## 2015-08-15 MED ORDER — CELECOXIB 200 MG PO CAPS
200 MG | ORAL_CAPSULE | Freq: Two times a day (BID) | ORAL | 1 refills | Status: DC
Start: 2015-08-15 — End: 2016-08-11

## 2015-08-15 MED ORDER — TRIAMCINOLONE ACETONIDE 40 MG/ML IJ SUSP
40 MG/ML | INTRAMUSCULAR | Status: AC
Start: 2015-08-15 — End: ?

## 2015-08-15 MED ORDER — LIDOCAINE HCL (PF) 1 % IJ SOLN
1 % | Freq: Once | INTRAMUSCULAR | Status: AC | PRN
Start: 2015-08-15 — End: 2015-08-15
  Administered 2015-08-15: 22:00:00 3 via INTRADERMAL

## 2015-08-15 MED ORDER — METHYLPREDNISOLONE ACETATE 80 MG/ML IJ SUSP
80 MG/ML | Freq: Once | INTRAMUSCULAR | Status: AC | PRN
Start: 2015-08-15 — End: 2015-08-15
  Administered 2015-08-15: 22:00:00 80 via EPIDURAL

## 2015-08-15 MED FILL — KENALOG 40 MG/ML IJ SUSP: 40 MG/ML | INTRAMUSCULAR | Qty: 1

## 2015-08-15 MED FILL — DEPO-MEDROL 80 MG/ML IJ SUSP: 80 MG/ML | INTRAMUSCULAR | Qty: 1

## 2015-08-15 MED FILL — SENSORCAINE-MPF 0.25 % IJ SOLN: 0.25 % | INTRAMUSCULAR | Qty: 10

## 2015-08-15 MED FILL — MARCAINE PRESERVATIVE FREE 0.5 % IJ SOLN: 0.5 % | INTRAMUSCULAR | Qty: 30

## 2015-08-15 MED FILL — SODIUM CHLORIDE 0.9 % IJ SOLN: 0.9 % | INTRAMUSCULAR | Qty: 10

## 2015-08-15 NOTE — Procedures (Shared)
Nursing Admission Record    Current Issues / Falls / ER Visits:  No    Percentage of Pain Relief after Last Procedure:  100 %    How long lasted:  3 months    Radiology exams received during the last 12 months: No     MRI exams received in the past 2 years:  No  Labs during the last 12 months: No    Education Provided:  []  Review of [place]  []  Agreement Review  []  Compliance Issues Discussed    []  Cognitive Behavior Needs []  Exercise []  Review of Test []  Financial Issues  []  Tobacco/Alcohol Use []  Teaching []  New Patient []  Picture Obtained    Physician Plan:  []  Outgoing Referral  []  Pharmacy Consult  []  Test Ordered   []  Obtained Test Results / Consult Notes  []  UDS due at next visit, verified per EPIC      []  Suspected Physical Abuse or Suicide Risk assessed - IF YES COMPLETE QUESTIONS BELOW    If any of the following questions are answered yes - contact attending physician for referral:    Has been considering harming self to escape stress, pain problems?  []  YES  []  NO  Has a suicide plan? []  YES  []  NO  Has attempted suicide in the past?   []  YES  []  NO  Has a close friend or family member who committed suicide?  []  YES  []  NO    Patient Referred To :     Additional Notes:    Assessment Completed by:  Electronically signed by Aris Georgia, RN on 08/15/2015 at 4:09 PM

## 2015-08-15 NOTE — Discharge Instructions (Signed)
Dr. Claudie Revering  Discharge Instructions / Information        You have had the procedure(s) called:    []  Cervical Epidural     [x]  Lumbar Epidural     []  _____________Injection  []  Cervical Facets  []  Lumbar Facets []  Radiofrequency Lesioning  []  Occipital Nerve Blocks []  Caudal Epidural []  Transforaminal Epidural  []  Trigger Point Injections []  SI Joint Injection []  ______________________    Please follow these instructions carefully.      If you have questions or problems you may call [phone].     You have received the following medications:  [x]  Lidocaine [x]  Bupivacaine: []  0.50%   [x]  0.25% [x]  DepoMedrol   []  Normal Saline  [x]  Omnipaque  []  Versed []  Sodium Bicarbonate    []  Botox  []  Kenalog   []  Diprivan  []  _____________________    PATIENT INFORMATION:  You may experience the following symptoms after your procedure.  These symptoms are normal and should not cause alarm.    *An increase in our pain may occur.  This may last 24-48 hours after your procedure.  *No change in pain.  *Weakness or numbness in your affected extremity.  This will usually only last a few hours.    REMEMBER TO REPORT THE FOLLOWING SYMPTOMS TO YOUR DOCTOR:  *Redness, swelling. Or drainage at the injection site.    *Unusual pain that interferes with your usual activities of daily living.    OTHER INSTRUCTIONS:  [x]  I will apply ice to the injection site for 24 hours.  Apply 15-20 minutes and take off 40 minutes.  [x]  I understand if a steroid was used it will take effect in 3-5 days.  [x]  I have received my personal belongings and valuable.  [x]  I have received a copy of my discharge instructions and understand to my satisfaction.  [x]  I will not drive for []  12 hours    [x]  24 hours after my injections.  [x]  I understand that today I will  [x]  rest  []  walk and move freely  []  other   []  Additional instructions:            Patient Discharge:  [x]  Home  []  Hospital  []  Other     How:  []  Ambulatory  [x]  Wheelchair   []  Stretcher    []  Other      Accompanied by:  [x]  Family Member  []  Friend  []  Hospital Staff  []  Ambulance Staff    []  Other

## 2015-08-15 NOTE — Procedures (Signed)
DATE: 08/15/2015      REASON FOR VISIT: Lumbar Degenerative Disc Disease,Lumbar Radiculopathy      PROCEDURE: Lumbar Epidural Steroid Injection.   []  Moderate Sedation    DESCRIPTION OF PROCEDURE:              After obtaining informed consent, the patient was taken to the procedure room, positioned prone, and sterilely prepped.  The procedure was performed under fluoroscopic guidance.  First 5 ml of 1% Xylocaine was used at L4-5 for local anesthesia.  A 19-gauge Hustead needle was advanced to the epidural space.  The was confirmed on the fluoroscope with an injection of [x]  2ml  Contrast.  Then, [x]  5ml of 0.25% Marcaine, [x]  4ml of Normal Saline, and [x]  80 mg of Depo Medrol was gently injected.        There were no complications.    DIAGNOSES:  []  Low Back Pain  [x]  *Lumbar Radiculopathy  [x]  *Lumbar Degenerative Disc Disease  []  *Lumbar Spinal Stenosis  []  *Lumbar Postlaminectomy Syndrome  []  Other  Nursing Admission Record    Current Issues / Falls / ER Visits: No  Percentage of Pain Relief after Last Procedure: 100 % How long lasted: 3 months  Radiology exams received during the last 12 months: No  MRI exams received in the past 2 years: No  Labs during the last 12 months: No      COMMENTS:  Patient complains of low back pain, that radiates down bilateral legs in the back. Patient complains of right hip pain. Pain is rated 7/10. Discussed the risk and benefits of procedure with patient. Will proceed today with Lumbar Epidural Steroid Injections.   Had recent increase in symptoms radiating to hips and upper legs. Does not want a surgery consult.  PLAN:  [x]  Will return to office in  3 month(s)for  [x]  Planned Procedure  []  Office Visit  []  Prescriptions were given today   []  No prescriptions needed today  []  Patient is to call with any questions or concerns which may arise prior to the next office visit.                        []  Over 50% of today's appointment was given to discussion, evaluation and  counseling.

## 2015-08-16 MED ORDER — CIPROFLOXACIN HCL 500 MG PO TABS
500 MG | ORAL_TABLET | Freq: Two times a day (BID) | ORAL | 0 refills | Status: AC
Start: 2015-08-16 — End: 2015-08-26

## 2015-11-05 ENCOUNTER — Encounter: Attending: Cardiovascular Disease | Primary: Family Medicine

## 2015-11-12 ENCOUNTER — Encounter: Payer: MEDICARE | Attending: Cardiovascular Disease | Primary: Family Medicine

## 2015-11-12 ENCOUNTER — Encounter

## 2015-11-13 MED ORDER — PRAVASTATIN SODIUM 40 MG PO TABS
40 MG | ORAL_TABLET | ORAL | 5 refills | Status: DC
Start: 2015-11-13 — End: 2016-08-11

## 2015-11-15 ENCOUNTER — Inpatient Hospital Stay: Admit: 2015-11-15 | Discharge: 2015-11-15 | Payer: MEDICARE | Attending: Pain Medicine | Primary: Family Medicine

## 2015-11-15 ENCOUNTER — Inpatient Hospital Stay: Admit: 2015-11-15 | Discharge: 2015-11-15 | Payer: MEDICARE | Primary: Family Medicine

## 2015-11-15 ENCOUNTER — Encounter

## 2015-11-15 DIAGNOSIS — M5136 Other intervertebral disc degeneration, lumbar region: Secondary | ICD-10-CM

## 2015-11-15 DIAGNOSIS — M5116 Intervertebral disc disorders with radiculopathy, lumbar region: Secondary | ICD-10-CM

## 2015-11-15 MED ORDER — LIDOCAINE-EPINEPHRINE 1 %-1:200000 IJ SOLN
1 percent-:200000 | Freq: Once | INTRAMUSCULAR | Status: AC | PRN
Start: 2015-11-15 — End: 2015-11-15
  Administered 2015-11-15: 20:00:00 2 via EPIDURAL

## 2015-11-15 MED ORDER — LIDOCAINE HCL (PF) 1 % IJ SOLN
1 % | Freq: Once | INTRAMUSCULAR | Status: AC | PRN
Start: 2015-11-15 — End: 2015-11-15
  Administered 2015-11-15: 20:00:00 3 via SUBCUTANEOUS

## 2015-11-15 MED ORDER — IOHEXOL 240 MG/ML IJ SOLN
240 MG/ML | Freq: Once | INTRAMUSCULAR | Status: AC | PRN
Start: 2015-11-15 — End: 2015-11-15
  Administered 2015-11-15: 20:00:00 2 via EPIDURAL

## 2015-11-15 MED ORDER — BUPIVACAINE HCL (PF) 0.25 % IJ SOLN
0.25 | INTRAMUSCULAR | Status: AC
Start: 2015-11-15 — End: 2015-11-15

## 2015-11-15 MED ORDER — TRIAMCINOLONE ACETONIDE 40 MG/ML IJ SUSP
40 MG/ML | Freq: Once | INTRAMUSCULAR | Status: AC | PRN
Start: 2015-11-15 — End: 2015-11-15
  Administered 2015-11-15: 20:00:00 20 via INTRA_ARTICULAR

## 2015-11-15 MED ORDER — BUPIVACAINE HCL (PF) 0.5 % IJ SOLN
0.5 % | Freq: Once | INTRAMUSCULAR | Status: AC | PRN
Start: 2015-11-15 — End: 2015-11-15
  Administered 2015-11-15: 20:00:00 4.5 via INTRA_ARTICULAR

## 2015-11-15 MED ORDER — BUPIVACAINE HCL (PF) 0.5 % IJ SOLN
0.5 % | INTRAMUSCULAR | Status: AC
Start: 2015-11-15 — End: ?

## 2015-11-15 MED ORDER — SODIUM CHLORIDE 0.9 % IJ SOLN
0.9 % | Freq: Once | INTRAMUSCULAR | Status: AC | PRN
Start: 2015-11-15 — End: 2015-11-15
  Administered 2015-11-15: 20:00:00 4

## 2015-11-15 MED ORDER — METHYLPREDNISOLONE ACETATE 80 MG/ML IJ SUSP
80 MG/ML | INTRAMUSCULAR | Status: AC
Start: 2015-11-15 — End: ?

## 2015-11-15 MED ORDER — METHYLPREDNISOLONE ACETATE 80 MG/ML IJ SUSP
80 MG/ML | Freq: Once | INTRAMUSCULAR | Status: AC | PRN
Start: 2015-11-15 — End: 2015-11-15
  Administered 2015-11-15: 20:00:00 80 via EPIDURAL

## 2015-11-15 MED ORDER — BUPIVACAINE HCL (PF) 0.25 % IJ SOLN
0.25 % | Freq: Once | INTRAMUSCULAR | Status: AC | PRN
Start: 2015-11-15 — End: 2015-11-15
  Administered 2015-11-15: 20:00:00 5 via EPIDURAL

## 2015-11-15 MED ORDER — TRIAMCINOLONE ACETONIDE 40 MG/ML IJ SUSP
40 | INTRAMUSCULAR | Status: AC
Start: 2015-11-15 — End: 2015-11-15

## 2015-11-15 MED ORDER — SODIUM CHLORIDE 0.9 % IJ SOLN
0.9 % | INTRAMUSCULAR | Status: AC
Start: 2015-11-15 — End: ?

## 2015-11-15 MED FILL — SODIUM CHLORIDE 0.9 % IJ SOLN: 0.9 % | INTRAMUSCULAR | Qty: 10

## 2015-11-15 MED FILL — MARCAINE PRESERVATIVE FREE 0.5 % IJ SOLN: 0.5 % | INTRAMUSCULAR | Qty: 30

## 2015-11-15 MED FILL — DEPO-MEDROL 80 MG/ML IJ SUSP: 80 MG/ML | INTRAMUSCULAR | Qty: 1

## 2015-11-15 MED FILL — SENSORCAINE-MPF 0.25 % IJ SOLN: 0.25 % | INTRAMUSCULAR | Qty: 10

## 2015-11-15 MED FILL — KENALOG 40 MG/ML IJ SUSP: 40 MG/ML | INTRAMUSCULAR | Qty: 1

## 2015-11-15 NOTE — Procedures (Signed)
DATE: 11/15/2015      REASON FOR VISIT: Lumbar Degenerative Disc, Lumbar Radiculopathy      PROCEDURE: Lumbar Epidural Steroid Injection.   []  Moderate Sedation    DESCRIPTION OF PROCEDURE:              After obtaining informed consent, the patient was taken to the procedure room, positioned prone, and sterilely prepped.  The procedure was performed under fluoroscopic guidance.  First 5 ml of 1% Xylocaine was used at L5-S1 for local anesthesia.  A 19-gauge Hustead needle was advanced to the epidural space.  The was confirmed on the fluoroscope with an injection of [x]  2ml   Contrast.  Then, [x]  5ml of 0.25% Marcaine, [x]  4ml of Normal Saline, and [x]  80 mg of Depo Medrol was gently injected.        There were no complications.    DIAGNOSES:  []  Low Back Pain  [x]  *Lumbar Radiculopathy  [x]  *Lumbar Degenerative Disc Disease  []  *Lumbar Spinal Stenosis  []  *Lumbar Postlaminectomy Syndrome  []  Other        REASON FOR VISIT: Bilateral Sacroilitis      PROCEDURE:  Sacroiliac Joint Injection.    []  Moderate Sedation    DESCRIPTION OF PROCEDURE:    After obtaining informed consent, the patient was taken to the procedure room, positioned prone, and sterilely prepped.  The fluoroscope was positioned for guidance.  Fluoroscopic imaging confirmation, of intra-articular needle positioning, was confirmed within the [x]  Left  sacroiliac joint.  Then, 5ml of 0.5% Marcaine and 20 mg of Kenalog was gently injected.  There were no complications.  [x]  The fluoroscope was repositioned to the  [x]  Right and fluoroscopic imaging confirmation, of intra-articular needle positioning, was confirmed.  The 5ml of 0.5%Marcaine and 20 mg Kenalog was gently injected.      There were no complications.    DIAGNOSES:  []  *Sacroiliitis  []  Left []  Right []  Bilateral  []  Unspecified Arthropathy involving pelvic region and thigh.        COMMENTS:  Patient complains oflower back pain that radiates to the right buttock to the right hip and down the left  leg to the toes. Pain is rated 2/10.        PLAN:  []  Will return to office in 1-2 months for RFL  []  Planned Procedure []  Office Visit  []  Prescription were given today    []  No prescriptions needed today  []  Patient is to call with any questions or concerns which may arise prior to the next office visit.                      []  Over 50% of today's appointment was given to discussion, evaluation and counseling.

## 2015-11-15 NOTE — Progress Notes (Signed)
Nursing Admission Record    Current Issues / Falls / ER Visits:  No    Percentage of Pain Relief after Last Procedure:  90 %    How long lasted:  3 months    Radiology exams received during the last 12 months: No      MRI exams received in the past 2 years:  No  Physical therapy during the last 6 months: No      Labs during the last 12 months: Yes    Education Provided:  [x]  Review of [place]  []  Agreement Review  []  Compliance Issues Discussed    []  Cognitive Behavior Needs []  Exercise []  Review of Test []  Financial Issues  []  Tobacco/Alcohol Use [x]  Teaching []  New Patient []  Picture Obtained    Physician Plan:  []  Outgoing Referral  []  Pharmacy Consult  []  Test Ordered   []  Obtained Test Results / Consult Notes  []  UDS due at next visit, verified per EPIC      []  Suspected Physical Abuse or Suicide Risk assessed - IF YES COMPLETE QUESTIONS BELOW    If any of the following questions are answered yes - contact attending physician for referral:    Has been considering harming self to escape stress, pain problems?  []  YES  []  NO  Has a suicide plan? []  YES  []  NO  Has attempted suicide in the past?   []  YES  []  NO  Has a close friend or family member who committed suicide?  []  YES  []  NO    Patient Referred To :     Additional Notes:    Assessment Completed by:  Electronically signed by Leeann Must, RN on 11/15/2015 at 2:29 PM

## 2015-11-15 NOTE — Progress Notes (Signed)
Procedure:  Level of Consciousness: [x]Alert [x]Oriented []Disoriented []Lethargic  Anxiety Level: [x]Calm []Anxious []Depressed []Other  Skin: [x]Warm [x]Dry []Cool []Moist []Intact []Other  Cardiovascular: [x]Palpitations: [x]Never []Occasionally []Frequently  Chest Pain: [x]No []Yes  Respiratory:  [x]Unlabored []Labored []Cough ([] Productive []Unproductive)  HCG Required: [x]No []Yes   Results: []Negative []Positive  Knowledge Level:        [x]Patient/Other verbalized understanding of pre-procedure instructions.        [x]Assessment of post-op care needs (transportation, responsible caregiver)        [x]Able to discuss health care problems and how to deal with it.  Factors that Affect Teaching:        Language Barrier: [x]No []Yes - why:        Hearing Loss:        [x]No []Yes            Corrective Device:  []Yes [x]No        Vision Loss:           []No [x]Yes            Corrective Device:  [x]Yes []No        Memory Loss:       [x]No []Yes            []Short Term []Long Term  Motivational Level:  [x]Asks Questions                  []Extremely Anxious       [x]Seems Interested               []Seems Uninterested                  []Denies need for Education  Risk for Injury:  [x]Patient oriented to person, place and time  []History of frequent falls/loss of balance  Nutritional:  []Change in appetite   []Weight Gain   []Weight Loss  Functional:  []Requires assistance with ADL's

## 2015-11-15 NOTE — Discharge Instructions (Signed)
Dr. Claudie Revering  Discharge Instructions / Information        You have had the procedure(s) called:    []  Cervical Epidural     [x]  Lumbar Epidural     []  _____________Injection  []  Cervical Facets  []  Lumbar Facets []  Radiofrequency Lesioning  []  Occipital Nerve Blocks []  Caudal Epidural []  Transforaminal Epidural  []  Trigger Point Injections [x]  SI Joint Injection []  ______________________    Please follow these instructions carefully.      If you have questions or problems you may call [phone].     You have received the following medications:  [x]  Lidocaine []  Bupivicaine: [x]  0.50%   [x]  0.25% [x]  DepoMedrol   [x]  Normal Saline  []  Versed []  Sodium Bicarbonate   []  Isovue   []  Botox  [x]  Kenalog   []  Diprovan  [x] omnipaque _____________________    PATIENT INFORMATION:  You may experience the following symptoms after your procedure.  These symptoms are normal and should not cause alarm.     An increase in our pain may occurr.  This may last 24-48 hours after your procedure.   No change in pain.   Weakness or numbness in your affected extremity.  This will usually only last a few hours.    REMEMBER TO REPORT THE FOLLOWING SYMPTOMS TO YOUR DOCTOR:   Redness, swelling. Or drainage at the injection site.   Unusual pain that interferes with your usual activities of daily living.    OTHER INSTRUCTIONS:  [x]  I will apply ice to the injection site for 24 hours.  Apply 15-2- minutes and take off 40 minutes.  [x]  I understand if a steroid was used it will take effect in 3-5 days.  [x]  I have received my personal belongings and valuable.  [x]  I have received a copy of my discharge instructions and understand to my satisfaction.  [x]  I will not drive for [x]  12 hours    []  24 hours after my injections.  [x]  I understand that today I will  [x]  rest  []  walk and move freely  []  other _________________  []  Additional instructions:  ____________________________________________________________________________        Patient  Discharge:  [x]  Home  []  Hospital  []  Other  ___________________________________    How:  []  Ambulatory  [x]  Wheelchair   []  Stretcher   []  __________________________________    Accompanied by:  []  Family Member  []  [facility] Staff  []  Ambulance Staff    []  Other_____________________________

## 2015-11-15 NOTE — Procedures (Signed)
DATE: 11/15/2015      REASON FOR VISIT: Lumbar Degenerative Disc, Lumbar Radiculopathy      PROCEDURE: Lumbar Epidural Steroid Injection.   []  Moderate Sedation    DESCRIPTION OF PROCEDURE:              After obtaining informed consent, the patient was taken to the procedure room, positioned prone, and sterilely prepped.  The procedure was performed under fluoroscopic guidance.  First 5 ml of 1% Xylocaine was used at L4 - 5 for local anesthesia.  A 19-gauge Hustead needle was advanced to the epidural space.  The was confirmed on the fluoroscope with an injection of [x]  2ml   Contrast.  Then, [x]  5ml of 0.25% Marcaine, [x]  4ml of Normal Saline, and [x]  80 mg of Depo Medrol was gently injected.        There were no complications.    DIAGNOSES:  []  Low Back Pain  [x]  *Lumbar Radiculopathy  [x]  *Lumbar Degenerative Disc Disease  []  *Lumbar Spinal Stenosis  []  *Lumbar Postlaminectomy Syndrome  []  Other        REASON FOR VISIT: Bilateral Sacroilitis      PROCEDURE:  Sacroiliac Joint Injection.    []  Moderate Sedation    DESCRIPTION OF PROCEDURE:    After obtaining informed consent, the patient was taken to the procedure room, positioned prone, and sterilely prepped.  The fluoroscope was positioned for guidance.  Fluoroscopic imaging confirmation, of intra-articular needle positioning, was confirmed within the [x]  Right  sacroiliac joint.  Then, 5ml of 0.5% Marcaine and 20 mg of Kenalog was gently injected.  There were no complications.This was repeated identically on the left.    There were no complications.    DIAGNOSES:  [x]  *Sacroiliitis  []  Left []  Right [x]  Bilateral  []  Unspecified Arthropathy involving pelvic region and thigh.    Nursing Admission Record    Current Issues / Falls / ER Visits: No  Percentage of Pain Relief after Last Procedure: 90 % How long lasted: 3 months  Radiology exams received during the last 12 months: No  MRI exams received in the past 2 years: No  Physical therapy during the last 6  months: No  Labs during the last 12 months: Yes          COMMENTS:  Patient complains oflower back pain that radiates to the right buttock to the right hip and down the left leg to the toes. Pain is rated 2/10. Patient feels that he gained 90% relief that lasted him 3 months from last injections.  Discussed the importance of daily exercising with a focus on torso strengthening.  Discussed the risk and benefits of procedure with patient. Will proceed today with Lumbar Epidural Steroid Injection today.        PLAN:  []  Will return to office in 1-2 months for RFL  []  Planned Procedure []  Office Visit  []  Prescription were given today    []  No prescriptions needed today  []  Patient is to call with any questions or concerns which may arise prior to the next office visit.                      []  Over 50% of today's appointment was given to discussion, evaluation and counseling.

## 2015-12-10 NOTE — Telephone Encounter (Signed)
Left message with wife for patient to return the call to the office.

## 2015-12-13 ENCOUNTER — Ambulatory Visit
Admit: 2015-12-13 | Discharge: 2015-12-13 | Payer: MEDICARE | Attending: Cardiovascular Disease | Primary: Family Medicine

## 2015-12-13 DIAGNOSIS — I2510 Atherosclerotic heart disease of native coronary artery without angina pectoris: Secondary | ICD-10-CM

## 2015-12-13 NOTE — Patient Instructions (Addendum)
How to take:  NITROGLYCERIN (Nitrostat) 0.4 mg tablets, sublingual.  Nitroglycerin is in a group of drugs called nitrates. Nitroglycerin dilates (widens) blood vessels, making it easier for blood to flow through them and easier for the heart to pump.    Dosing Guidelines for Nitroglycerin Tablets   At the start of an angina (chest pain) attack, place one tablet under the tongue or between the cheek and gum.  Do not swallow or chew the tablet; let it dissolve on its own.  If necessary, a second and third tablet may be used, with five minutes between using each tablet.  If you use a third tablet and your chest pain continues, it is time to seek immediate medical attention.  Call 911 immediately and have someone drive you to the emergency room.  You may be having a heart attack or other serious heart problem.     To prevent angina from exercise or stress, use 1 tablet 5 to 10 minutes before the activity.    ______________________________      Coronary Artery Disease   (CAD; Coronary Atherosclerosis; Silent MI; Coronary Heart Disease; Ischemic Heart Disease; Atherosclerosis of the Coronary Arteries)     Definition   Coronary arteries bring oxygen rich blood to the heart muscle. Coronary artery disease (CAD) is blockage of these arteries. If the blockage is complete, areas of the heart muscle may be damaged. In severe case the heart muscle dies. This can lead to a heart attack, also known as a myocardial infarction (MI).     Coronary artery disease is the most common form of heart disease. It is the leading cause of death worldwide.     Causes include:   Thickening of the walls of the arteries feeding the heart muscle   Accumulation of fatty plaques within the coronary arteries   Sudden spasm of a coronary artery   Narrowing of the coronary arteries   Inflammation within the coronary arteries   Development of a blood clot within the coronary arteries that blocks blood flow      Major risk factors include:   Sex: male  (men have a greater risk of heart attack than women)   Age: 72 and older for men, 69 and older for women   Heredity: strong family history of heart disease   Obesity and being overweight   Smoking   High blood pressure   Sedentary lifestylePoor fitness can also increase your risk of CAD and premature death.   High cholesterol (specifically, high LDL cholesterol, and low HDL cholesterol)   Diabetes   Metabolic syndrome (combination of high blood pressure, abdominal obesity, and insulin resistance)     Other risk factors may include:   Sleep Apnea  Stress   Excessive alcohol use   Depression   A diet that is high in saturated fat, trans fat, cholesterol, and/or caloriesDrinking sugary beverages on a regular basis may increase your risk of CAD.     Symptoms   CAD may progress without any symptoms.   Angina is chest pain that comes and goes. It often has a squeezing or pressure-like quality. It may radiate into the shoulder(s), arm(s), or jaw. Angina usually lasts for about 2-10 minutes. It is often relieved with rest. Angina can be triggered by:   Exercise or exertion   Emotional stress   Cold weather   A large meal   Chest pain may indicate more serious unstable angina or a heart attack if:   It is unrelieved  by rest or nitroglycerin   Severe angina   Angina that begins at rest (with no activity)   Angina that lasts more than 15 minutes   Accompanying symptoms may include:   Shortness of breath   Sweating   Nausea   Weakness   Immediate medical attention is needed for unstable angina. CAD in women may cause less classic chest pain. It is likely to start with shortness of breath and fatigue.   Diagnosis   If you go to the emergency room with chest pain, some tests will be done right away. The tests will attempt to see if you are having angina or a heart attack. If you have a stable pattern of angina, other tests may be done to determine the severity of your disease.   The doctor will ask about your symptoms and medical  history. A physical exam will be done.   Tests may include:   Blood tests to look for certain substances in the blood called troponins which help the doctor determine if you are having a heart attack   Electrocardiogram (ECG, EKG) records the heart's activity by measuring electrical currents through the heart muscle, and can reveal evidence of past heart attacks, acute heart attacks, and heart rhythm problems   Echocardiogram uses high-frequency sound waves (ultrasound) to examine the size, shape, and motion of the heart, giving information about the structure and function of the heart   Exercise stress test records the heart's electrical activity during increased physical activity   Nuclear stress test the heart is observed while exercising and radioactive material highlights impaired blood flow to help locate problem areas   Coronary calcium scoring a type of x-ray called a CAT scan that uses a computer to look for the presence of calcium in the heart arteries   Coronary angiography x-rays taken after a dye is injected into the arteries to allows the doctor to look for abnormalities in the arteries   Treatment   Treatment may include:   Nitroglycerin   This medicine is usually given during an attack of angina. It can be given as a tablet that dissolves under the tongue or as a spray. Longer-lasting types can be used to prevent angina before an activity known to cause it. These may be given as pills or applied as patches or ointments.   Blood-Thinning Medications   A small, daily dose of aspirin has been shown to decrease the risk of heart attack. Ask your doctor before taking aspirin daily.   Warfarin (Coumadin)   Ticlopidine (Ticlid)   Clopidogrel (Plavix)   Beta-Blockers, Calcium-Channel Blockers, and ACE-Inhibitors   These may help prevent angina. In some cases, they may lower the risk of heart attack.   Medications to Lower Cholesterol   Medicines, like statins, are often prescribed to people who have CAD.  Statins (eg, atorvastatin [Lipitor]) lower cholesterol levels, which can help to prevent CAD events.   Revascularization   Patients with severe blockages in their coronary arteries may benefit from procedures to immediately improve blood flow to the heart muscle:   Percutaneous coronary interventions (PCI)such as balloon angioplasty , in some cases, a wire mesh stent is placed to hold the artery open   Coronary artery bypass grafting (CABG) segments of vessels are taken from other areas of the body and are sewn into the heart arteries to reroute blood flow around blockages   Some studies have shown that CABG may be more effective than PCI. Lifestyle changes and intensive medicine  may also be just as effective as PCI.   Options for Refractory Angina   For patients who are not candidates for revascularization procedures but have continued angina despite medicine, options include:   Enhanced external counterpulsation (EECP) large air bags are inflated around the legs in tune with the heart beat. The patient receives 5 one-hour treatments per week for seven weeks. This has been shown to reduce angina and may improve symptom-free exercise duration.   Transmyocardial revascularization (TMR) surgical procedure done with laser to reduce chest pain.   Researchers are also studying gene therapy as a possible treatment.   Prevention   To reduce your risk of getting coronary artery disease:   Maintain a healthy weight.   Eat a heart healthy diet that is low in saturated fat , red meat and processed meats, and rich in whole grain , fruits, and vegetables .   Begin a safe exercise program with the advice of your doctor.   If you smoke, quit .   Treat your high blood pressure and/or diabetes.   Treat high cholesterol or triglycerides.   Ask your doctor about taking a low-dose aspirin every day.   In certain patients, taking rosuvastatin (Crestor) may be another option. Talk to your doctor.

## 2015-12-14 NOTE — Progress Notes (Signed)
[facility]  [address], [address], [place]  [address]  The following was transcribed by Suella Broad, [name]     Office Visit:  12/13/2015    Richard Sims DOB: 06-14-1951, 65 y.o. Male        Chief Complaint   Patient presents with   . 1 Year Follow Up     denies any cardiac symptoms.  States "He was down in the back over winter and has gained a few pounds."   He is doing pretty good.       . Coronary Artery Disease   . Hyperlipidemia     History of Present Illness:   Richard Sims is followed for coronary artery disease and hypertension.  He has no complaints today.  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   He has no angina or dyspnea.        Past Medical History:   Diagnosis Date   . CAD (coronary artery disease)    . Cervical disc disease    . GERD (gastroesophageal reflux disease)    . Hyperlipidemia     [facility] manage his lipid/liver panel   . Hypertension    . Low back pain radiating to left leg 09/11/2014   . Lumbar degenerative disc disease 08/11/2014   . Lumbar radiculopathy 08/11/2014   . Nephrolithiasis    . S/P CABG x 1     MICS with LIMA to LAD via left mini thoracotomy     Allergies:  Crestor [rosuvastatin] and Simvastatin     Outpatient Prescriptions Marked as Taking for the 12/13/15 encounter (Office Visit) with Onesti Bonfiglio. [name], MD   Medication Sig Dispense Refill   . esomeprazole (NEXIUM) 40 MG delayed release capsule      . pravastatin (PRAVACHOL) 40 MG tablet TAKE 1 TABLET BY MOUTH DAILY 90 tablet 5   . celecoxib (CELEBREX) 200 MG capsule Take 1 capsule by mouth 2 times daily 180 capsule 1   . cyclobenzaprine (FLEXERIL) 10 MG tablet Take 1 tablet by mouth 3 times daily as needed for Muscle spasms 45 tablet 2   . metoprolol (LOPRESSOR) 25 MG tablet Take one-half tablet by mouth twice daily 90 tablet 3   . tiZANidine (ZANAFLEX) 4 MG tablet Take 1 tablet by mouth 2 times  daily as needed (one month supply) 45 tablet 2   . RAPAFLO 8 MG CAPS 8 mg every evening      . nitroGLYCERIN (NITROSTAT) 0.4 MG SL tablet Place 1 tablet under the tongue every 5 minutes as needed for Chest pain up to 3 doses, in no relief report to the ER. 25 tablet 3   . dutasteride (AVODART) 0.5 MG capsule Take 0.5 mg by mouth daily.     Marland Kitchen aspirin 81 MG EC tablet Take 81 mg by mouth daily.         I have reviewed and confirm the Past Medical History, Allergies, and Medications sections above and have updated the computerized patient record.     Past Surgical History:   Procedure Laterality Date   . CARDIAC CATHETERIZATION  05/29/11   [facility]    EF  over 60%   . CARDIAC CATHETERIZATION  12/12/2011  [facility]    with PTCA, stent to LAD   . CORONARY ARTERY BYPASS GRAFT  06/02/2011     HYBRID MICS CABG, LT THORACOTOMY APPROACH, LIMA-LAD,  PCI DES-RCA   . JOINT REPLACEMENT      LEFT KNEE   . KNEE ARTHROSCOPY      BILATERAL   . SPINE SURGERY      ACF X 2     Family History   Problem Relation Age of Onset   . Heart Disease Father    . High Blood Pressure Father    . High Cholesterol Father    . High Blood Pressure Mother    . Depression Paternal Grandmother      Social History   Substance Use Topics   . Smoking status: Never Smoker   . Smokeless tobacco: Never Used   . Alcohol use Yes      Comment: Occassional       Data:   BP Readings from Last 3 Encounters:   12/13/15 136/80   11/15/15 131/72   08/15/15 133/74    Pulse Readings from Last 3 Encounters:   12/13/15 72   11/15/15 58   08/15/15 63    Wt Readings from Last 3 Encounters:   12/13/15 193 lb (87.5 kg)   11/15/15 195 lb (88.5 kg)   08/15/15 188 lb (85.3 kg)        Review of Systems  Constitutional:  No significant activity change, appetite change, or unexpected weight change.  Negative for fever or chills.  No diaphoresis or significant fatigue.   HENT:  Negative for nosebleeds, facial swelling, rhinorrhea and neck stiffness.  RESPIRATORY:  Negative for shortness of breath.   No cough, wheezing or stridor.   CARDIOVASCULAR:  Negative for chest pain, palpitations and leg swelling.  GASTROINTESTINAL:   Negative for abdominal distention.  GENITOURINARY:  Negative for dysuria, urgency and frequency.  MUSCULOSKELETAL:   Negative for myalgia, arthralgia and gait problem.  SKIN:  Negative for color change, pallor, rash and wound.  NEUROLOGICAL:   Negative for dizziness, weakness, light-headedness, numbness and headaches.  Negative for speech difficulty.  HEMATOLOGICAL:   Does not bruise or bleed easily.  PSYCHIATRIC/BEHAVIORAL:   No anxiety or confusion.   Except as noted in the HPI, all other systems are negative.         Physical Exam:  BP 136/80  Pulse 72  Ht 5\' 8"  (1.727 m)  Wt 193 lb (87.5 kg)  BMI 29.35 kg/m2  Constitutional:  The patient is a pleasant 65 y.o. male in no acute distress.  He appears well-developed and well-nourished.  HEAD:  Normocephalic without evidence of old or recent trauma.  EYES:  Sclerae clear.  Conjunctivae pink.  EOMs intact.  Pupils equal and round.  NOSE:  No nasal discharge or epistaxis.    MOUTH:  Teeth, gums and palate normal.   THROAT:  No lesions on lips or buccal mucosa.  Tongue protrudes in midline and is well papillated.  NECK:  Symmetrical without apparent mass or jugular venous distention.  Carotid pulses are 2+ to palpitation bilaterally without noted bruit.          CHEST:  Clear bilateral breath sounds without wheezes or rhonchi.    RESPIRATORY:  The lungs clear to auscultation bilaterally with normal respiratory effort.  CARDIOVASCULAR:   The heart demonstrates regular rhythm and rate with no cardiac murmur, gallop or rub noted to auscultation.          ABDOMEN:  Soft, nontender.  Active bowel sounds are present.      UPPER EXTREMITY EVALUATION:  Radial pulses palpable bilaterally.      LOWER EXTREMITY  EVALUATION:  Negative for peripheral edema.  Femoral, popliteal, dorsalis pedis, and posterior tibialis pulses 2+ to palpation bilaterally.  No  cyanosis or clubbing.  MUSCULOSKELETAL:  Normal muscle strength and tone.  No atrophy or abnormalities.  SKIN:  Warm, dry, intact.  No dermatitis or ulcers.  NEUROLOGIC:  Cranial nerves II through XII are grossly intact.      Assessment / Plan:   1.  Coronary artery disease appears stable on current therapy as listed, will continue.   2.  Hypertension - blood pressure appears well controlled on current therapy, will make no change.  3.  Continue current medications as prescribed.    4.  Wellness visit in six months.  Return to see me in a year.         _____________________________________________________  Electronically Signed by:   Donnita Farina. [name], M.D., F.A.Levy Wellman.Ophelia Sipe.   [facility]  _____________________________________________________  Copy:  [name], MD

## 2016-01-15 ENCOUNTER — Inpatient Hospital Stay: Admit: 2016-01-15 | Discharge: 2016-01-16 | Payer: MEDICARE | Attending: Family | Primary: Family Medicine

## 2016-01-15 DIAGNOSIS — M5116 Intervertebral disc disorders with radiculopathy, lumbar region: Secondary | ICD-10-CM

## 2016-01-15 MED ORDER — TIZANIDINE HCL 4 MG PO TABS
4 | ORAL_TABLET | Freq: Two times a day (BID) | ORAL | 2 refills | Status: DC | PRN
Start: 2016-01-15 — End: 2016-04-30

## 2016-01-15 NOTE — Progress Notes (Signed)
[facility]  Patient Pain Assessment  Progress Note        Chief complaint:   Chief Complaint   Patient presents with   ??? Back Pain     radiates down right buttocks       Current Pain Assessment:   Pain Assessment: 0-10  Pain Level: 0  Pain Type: Chronic pain  Pain Location: Back  Pain Orientation: Lower  Pain Radiating Towards: radiates down right buttocks  Pain Descriptors: Aching, Constant, Radiating  Pain Frequency: Intermittent  Pain Onset: On-going  Clinical Progression: Not changed  Effect of Pain on Daily Activities: limits daily activites    Pain medication assessment:    Patient is not taking narcotic medication.       Reports current medication is helping, continues to have on-going pain      Reports current pain medication increases ability to do activities of daily living     Discussed possible medication side effects, risk of tolerance and / or  dependence, alternative treatments      Encouraged to set goals of decreasing daily narcotic intake      Discussed effects of long term narcotic use       Yes No  Current side effects    If yes, comment:     Yes No  Pain control issues   If yes, comment:      Other Issues of Concern:   Yes No  Physical, Mental, or Social   If yes, comment:    Reports that he feels sleepy in the mornings when he takes Flexeril at [facility], wants to take something less sedating, will DC Flexeril and add Zanaflex, patient is agreeable to this plan     Yes No   Acute bladder or bowel changes     Injection Assessment::     Injection options discussed       Previous injections effective and increased ability to do daily activities      Not currently interested, encouraged to consider as a future alternative option    Procedure / location / percentage of pain relief / length of time, if applicable: reports percentage of pain relief after LESI (L5-S1) and Bilateral SI joint injections 11/15/15: 100 %  How long lasted: 3  months    Scheduled procedure, if applicable: LESI (L5-S1) and Bilateral SI joint injections next visit    Data:  Radiology exams received during the last 12 months: No, order given today (see plan below)  MRI exams received in the past 2 years: No  Physical therapy during the last 6 months: No    BMI: Body mass index is 29.6 kg/(m^2).     Normal      Underweight, nutrient rich foods and drinks discussed for weight maintenance     Overweight, low fat, low carb diet discussed for weight reduction    Activity:    Exercise discussed as beneficial to pain reduction     Encouraged stretching exercise     Patient encouraged to set daily goals of exercising     Tobacco use:   Never    Former    Current     Cessation discussed        Social History     Social History   ??? Marital status: Married     Spouse name: N/A   ??? Number of children: N/A   ??? Years of education: N/A     Social History Main Topics   ??? Smoking status:  Never Smoker   ??? Smokeless tobacco: Never Used   ??? Alcohol use Yes      Comment: rarely   ??? Drug use: No   ??? Sexual activity: Yes     Partners: Female     Other Topics Concern   ??? None     Social History Narrative                                                                      [] Yes [x] No  UDS done today?  [] Yes [] No   [x] N/A  Are you currently pregnant?     [x] Yes [] No  Zadie RhineKasper reviewed/compliant?   [] Yes [x] No  [] Will monitor Concern for prescription abuse?           Past Medical History:      Diagnosis Date   ??? CAD (coronary artery disease)    ??? Cervical disc disease    ??? GERD (gastroesophageal reflux disease)    ??? Hyperlipidemia     [facility] manage his lipid/liver panel   ??? Hypertension    ??? Low back pain radiating to left leg 09/11/2014   ??? Lumbar degenerative disc disease 08/11/2014   ??? Lumbar radiculopathy 08/11/2014   ??? Nephrolithiasis    ??? S/P CABG x 1     MICS with LIMA to LAD via left mini thoracotomy       Surgical History:  Past Surgical History:    Procedure Laterality Date   ??? [facility]    [facility]  [facility]   ??? [facility]    [facility], stent to LAD   ??? CORONARY ARTERY BYPASS GRAFT  06/02/2011     HYBRID MICS CABG, LT THORACOTOMY APPROACH, LIMA-LAD, PCI DES-RCA   ??? JOINT REPLACEMENT      LEFT KNEE   ??? KNEE ARTHROSCOPY      BILATERAL   ??? SPINE SURGERY      ACF X 2       Family History:  family history includes Depression in his paternal grandmother; Heart Disease in his father; High Blood Pressure in his father and mother; High Cholesterol in his father.     Allergies:  Crestor [rosuvastatin] and Simvastatin     Medications:  Current Outpatient Prescriptions   Medication Sig Dispense Refill   ??? esomeprazole (NEXIUM) 40 MG delayed release capsule      ??? pravastatin (PRAVACHOL) 40 MG tablet TAKE 1 TABLET BY MOUTH DAILY 90 tablet 5   ??? celecoxib (CELEBREX) 200 MG capsule Take 1 capsule by mouth 2 times daily 180 capsule 1   ??? esomeprazole Magnesium (NEXIUM) 40 MG PACK Take 40 mg by mouth as needed     ??? metoprolol (LOPRESSOR) 25 MG tablet Take one-half tablet by mouth twice daily 90 tablet 3   ??? tiZANidine (ZANAFLEX) 4 MG tablet Take 1 tablet by mouth 2 times daily as needed (one month supply) 45 tablet 2   ??? RAPAFLO 8 MG CAPS 8 mg every evening      ??? nitroGLYCERIN (NITROSTAT) 0.4 MG SL tablet Place 1 tablet under the tongue every 5 minutes as needed for Chest pain up to 3 doses, in no relief report  to the ER. 25 tablet 3   ??? dutasteride (AVODART) 0.5 MG capsule Take 0.5 mg by mouth daily.     ??? aspirin 81 MG EC tablet Take 81 mg by mouth daily.         No current facility-administered medications for this encounter.             Vitals:  BP 133/86   Pulse 90   Temp 97.4 ??F (36.3 ??C) (Temporal)    Resp 16   Ht 5\' 7"  (1.702 m)   Wt 189 lb (85.7 kg)   SpO2 95%   BMI 29.6 kg/m2     Physical Exam:  B/P: discussed using home monitor/diary, contact PCP if remains elevated  General appearance: no acute distress  Head:  NCAT, EOMI  Skin: Warm, Dry   Musculoskeletal: ambulatory per self, steady gait  Neurologic: alert and oriented X 3, speech clear  Mood and affect: appropriate, no SI or HI    Assessment:    *   Lumbar DDD with radiculopathy  *   Sacroiliitis, bilateral    Plan:    *  Patient is to call with any questions or concerns which may arise prior to the next office visit   *  Continue current dosage of Celebrex  *  DC Flexeril   *  Add Zanaflex 4 mg BID, PRN, #45  *  Lumbar flexion/extension XR order given to patient  *  Scheduled for repeat LESI (L5-S1) and Bilateral SI joint injections next visit, to bring driver    Controlled Substance Monitoring: Discussed with patient possible medication side effects, risk of tolerance and/or dependence, and alternative treatments. Discussed the effects of long term narcotic use. Patient encouraged to set daily goals of exercising and decreasing daily narcotic intake.      Electronically signed by Donnetta Hail, APRN on 01/15/2016 at 4:11 PM

## 2016-01-15 NOTE — Progress Notes (Signed)
Nursing Admission Record    Current Issues / Falls / ER Visits:  No    Percentage of Pain Relief after Last Procedure:  100 %    How long lasted:  3 months    Radiology exams received during the last 12 months: No, order given for xray 01/15/16       When na                                              Where na       Imaging on chart: No         Imaging records requested: No  MRI exams received in the past 2 years:  No  Physical therapy during the last 6 months: No       When: na                                             Where na  Labs during the last 12 months: No    Education Provided:  [x]  Review of [facility]  []  Agreement Review  []  Compliance Issues Discussed    []  Cognitive Behavior Needs [x]  Exercise []  Review of Test []  Financial Issues  [x]  Tobacco/Alcohol Use [x]  Teaching []  New Patient []  Picture Obtained    Physician Plan:  []  Outgoing Referral  []  Pharmacy Consult  []  Test Ordered   []  Obtained Test Results / Consult Notes  []  UDS due at next visit, verified per EPIC      []  Suspected Physical Abuse or Suicide Risk assessed - IF YES COMPLETE QUESTIONS BELOW    If any of the following questions are answered yes - contact attending physician for referral:    Has been considering harming self to escape stress, pain problems?  []  YES  [x]  NO  Has a suicide plan? []  YES  [x]  NO  Has attempted suicide in the past?   []  YES  [x]  NO  Has a close friend or family member who committed suicide?  []  YES  [x]  NO    Patient Referred To :     Additional Notes:    Assessment Completed by:  Electronically signed by Erroll Luna, RN on 01/15/2016 at 3:58 PM

## 2016-02-15 ENCOUNTER — Encounter: Attending: Pain Medicine | Primary: Family Medicine

## 2016-02-20 ENCOUNTER — Inpatient Hospital Stay: Admit: 2016-02-20 | Discharge: 2016-02-20 | Payer: MEDICARE | Primary: Family Medicine

## 2016-02-20 DIAGNOSIS — M5116 Intervertebral disc disorders with radiculopathy, lumbar region: Secondary | ICD-10-CM

## 2016-03-04 ENCOUNTER — Encounter

## 2016-03-05 MED ORDER — METOPROLOL TARTRATE 25 MG PO TABS
25 MG | ORAL_TABLET | ORAL | 3 refills | Status: DC
Start: 2016-03-05 — End: 2016-05-26

## 2016-03-07 ENCOUNTER — Inpatient Hospital Stay: Admit: 2016-03-07 | Discharge: 2016-03-07 | Payer: MEDICARE | Attending: Pain Medicine | Primary: Family Medicine

## 2016-03-07 DIAGNOSIS — M5116 Intervertebral disc disorders with radiculopathy, lumbar region: Secondary | ICD-10-CM

## 2016-03-07 NOTE — Progress Notes (Signed)
[facility]  Patient Pain Assessment  Consultation - @ATTENDING  WJXBJYN@    Primary Care Physician: Richard Bolus, MD    Chief complaint:   Chief Complaint   Patient presents with   . Back Pain     down to left leg to the knee   .    HISTORY OF PRESENT ILLNESS:  Richard Sims is 65 y.o. male with    HPI    Zadie Rhine compliant? yes  Concern for prescription abuse?no    Current Pain Assessment                       ADVERSE MEDICATION EFFECTS:   Constipation: no  Bowel Regimen: No:   Diet: common adult  Appetite:  ok  Sedation:  not applicable  Urinary Retention: no    FOCUSED PAIN SCALE:  Highest : 7  Lowest :1  Average: Range-1  When and What  was your last procedure:      Was your procedure effective:  yes    ACTIVITY/SOCIAL/EMOTIONAL:  Sleep Pattern: 5 hours per night. nightime awakenings  Energy Level:  Normal  Currently attending Physical Therapy:  No  Home Exercises: feels that he is very active  Mobility: no current mobility problem   Currently seeing a Psychiatrist or Psychologist:  No  Emotional Issues: normal   Mood: appropriate     ABERRANT BEHAVIORS SINCE LAST VISIT:  Have you ever been treated in another Pain Clinic no  Refills for prescriptions appropriate: yes  Lost rx/pills: no  Taking more medication than prescribed:  no  Are you receiving PAIN medications from  other doctors: no  Last Urine/Serum Drug Screen :  Was Serum/UDS as anticipated?  no  Are currently pregnant? no  Recent ER visits: No             Past Medical History      Diagnosis Date   . CAD (coronary artery disease)    . Cervical disc disease    . GERD (gastroesophageal reflux disease)    . Hyperlipidemia     [facility] manage his lipid/liver panel   . Hypertension    . Low back pain radiating to left leg 09/11/2014   . Lumbar degenerative disc disease 08/11/2014   . Lumbar radiculopathy 08/11/2014   . Nephrolithiasis    . S/P CABG x 1     MICS with LIMA to LAD via left mini thoracotomy       Surgical  History  Past Surgical History:   Procedure Laterality Date   . CARDIAC CATHETERIZATION  05/29/11   [facility]    EF  over 60%   . CARDIAC CATHETERIZATION  12/12/2011  [facility]    with PTCA, stent to LAD   . CORONARY ARTERY BYPASS GRAFT  06/02/2011     HYBRID MICS CABG, LT THORACOTOMY APPROACH, LIMA-LAD, PCI DES-RCA   . JOINT REPLACEMENT      LEFT KNEE   . KNEE ARTHROSCOPY      BILATERAL   . SPINE SURGERY      ACF X 2       Medications  Current Outpatient Prescriptions   Medication Sig Dispense Refill   . metoprolol tartrate (LOPRESSOR) 25 MG tablet TAKE 1/2 TABLET TWICE DAILY 90 tablet 3   . tiZANidine (ZANAFLEX) 4 MG tablet Take 1 tablet by mouth 2 times daily as needed (one month supply) 45 tablet 2   . esomeprazole (NEXIUM) 40 MG delayed release capsule      .  pravastatin (PRAVACHOL) 40 MG tablet TAKE 1 TABLET BY MOUTH DAILY 90 tablet 5   . celecoxib (CELEBREX) 200 MG capsule Take 1 capsule by mouth 2 times daily 180 capsule 1   . esomeprazole Magnesium (NEXIUM) 40 MG PACK Take 40 mg by mouth as needed     . RAPAFLO 8 MG CAPS 8 mg every evening      . nitroGLYCERIN (NITROSTAT) 0.4 MG SL tablet Place 1 tablet under the tongue every 5 minutes as needed for Chest pain up to 3 doses, in no relief report to the ER. 25 tablet 3   . dutasteride (AVODART) 0.5 MG capsule Take 0.5 mg by mouth daily.     Marland Kitchen aspirin 81 MG EC tablet Take 81 mg by mouth daily.         No current facility-administered medications for this encounter.        Allergies  Crestor [rosuvastatin] and Simvastatin    Family History  family history includes Depression in his paternal grandmother; Heart Disease in his father; High Blood Pressure in his father and mother; High Cholesterol in his father.    Social History  Social History     Social History   . Marital status: Married     Spouse name: N/A   . Number of children: N/A   . Years of education: N/A     Social History Main Topics   . Smoking status: Never Smoker   . Smokeless tobacco: Never Used   . Alcohol use  Yes      Comment: rarely   . Drug use: No   . Sexual activity: Yes     Partners: Female     Other Topics Concern   . Not on file     Social History Narrative      reports that he does not use illicit drugs.        REVIEW OF SYSTEMS:  ROS         GENERAL PHYSICAL EXAM:  Vitals: There were no vitals taken for this visit., There is no height or weight on file to calculate BMI.  Physical Exam Ortho Exam    DATA  Labs:  No results found for: LABBARB, LABBENZ, COCAINESCRN, LABOPIA, LABPHEN     Imaging:  Radiology Images and Reports reviewed where indicated and necessary    ASSESSMENT  Richard Sims is a 65 y.o. male with history of low back pain. Patient was scheduled for injections and patient is not wanting to proceed with Lumbar Epidural Steroid and Bilateral Sacroiliac Injections. Patient states that he has lost 6 pounds since last office visit. Patient denies having any pain currently.       His mood is good. He is productive in his work. He is still better from last time. He would like to be able to come in early if he has a flare up.   PLAN  Patient will call if he wants to schedule this procedure in the future.  3 month follow up     Electronically signed by Richard Georgia, RN on 03/07/2016 at 1:14 PM

## 2016-03-07 NOTE — Progress Notes (Shared)
Procedure:  Level of Consciousness: [x] Alert [x] Oriented [] Disoriented [] Lethargic  Anxiety Level: [x] Calm [] Anxious [] Depressed [] Other  Skin: [x] Warm [x] Dry [] Cool [] Moist [] Intact [] Other  Cardiovascular: [] Palpitations: [x] Never [] Occasionally [] Frequently  Chest Pain: [x] No [] Yes  Respiratory:  [x] Unlabored [] Labored [] Cough ([]  Productive [] Unproductive)  HCG Required: [x] No [] Yes   Results: [] Negative [] Positive  Knowledge Level:        [x] Patient/Other verbalized understanding of pre-procedure instructions.        [] Assessment of post-op care needs (transportation, responsible caregiver)        [] Able to discuss health care problems and how to deal with it.  Factors that Affect Teaching:        Language Barrier: [x] No [] Yes - why:        Hearing Loss:        [x] No [] Yes            Corrective Device:  [] Yes [] No        Vision Loss:           [x] No [] Yes            Corrective Device:  [] Yes [] No        Memory Loss:       [x] No [] Yes            [] Short Term [] Long Term  Motivational Level:  [x] Asks Questions                  [] Extremely Anxious       [x] Seems Interested               [] Seems Uninterested                  [] Denies need for Education  Risk for Injury:  [] Patient oriented to person, place and time  [] History of frequent falls/loss of balance  Nutritional:  [] Change in appetite   [] Weight Gain   [] Weight Loss  Functional:  Nursing Admission Record    Current Issues / Falls / ER Visits:  No    Percentage of Pain Relief after Last Procedure:  95 %    How long lasted:still helping  Radiology exams received during the last 12 months: No    MRI exams received in the past 2 years:  No  Physical therapy during the last 6 months: No    Labs during the last 12 months: {YES OR NO:20022}    Education Provided:  []  Review of [place]  []  Agreement Review  []  Compliance Issues Discussed    []  Cognitive Behavior Needs [x]  Exercise []  Review of Test []  Financial Issues  []  Tobacco/Alcohol Use [x]  Teaching []  New  Patient []  Picture Obtained    Physician Plan:  []  Outgoing Referral  []  Pharmacy Consult  []  Test Ordered   []  Obtained Test Results / Consult Notes  []  UDS due at next visit, verified per EPIC      []  Suspected Physical Abuse or Suicide Risk assessed - IF YES COMPLETE QUESTIONS BELOW    If any of the following questions are answered yes - contact attending physician for referral:    Has been considering harming self to escape stress, pain problems?  []  YES  [x]  NO  Has a suicide plan? []  YES  [x]  NO  Has attempted suicide in the past?   []  YES  [x]  NO  Has a close friend or family member who committed suicide?  []  YES  [x]  NO    Patient Referred To :  Additional Notes:    Assessment Completed by:  Electronically signed by Aris Georgia, RN on 03/07/2016 at 1:06 PM

## 2016-04-30 ENCOUNTER — Inpatient Hospital Stay: Admit: 2016-04-30 | Discharge: 2016-04-30 | Payer: MEDICARE | Attending: Pain Medicine | Primary: Family Medicine

## 2016-04-30 ENCOUNTER — Inpatient Hospital Stay: Admit: 2016-04-30 | Discharge: 2016-04-30 | Payer: MEDICARE | Primary: Family Medicine

## 2016-04-30 ENCOUNTER — Encounter

## 2016-04-30 DIAGNOSIS — M5116 Intervertebral disc disorders with radiculopathy, lumbar region: Secondary | ICD-10-CM

## 2016-04-30 DIAGNOSIS — M5136 Other intervertebral disc degeneration, lumbar region: Secondary | ICD-10-CM

## 2016-04-30 MED ORDER — BUPIVACAINE HCL (PF) 0.5 % IJ SOLN
0.5 | INTRAMUSCULAR | Status: AC
Start: 2016-04-30 — End: 2016-04-30

## 2016-04-30 MED ORDER — BUPIVACAINE HCL (PF) 0.25 % IJ SOLN
0.25 | INTRAMUSCULAR | Status: AC
Start: 2016-04-30 — End: 2016-04-30

## 2016-04-30 MED ORDER — METHYLPREDNISOLONE ACETATE 80 MG/ML IJ SUSP
80 | INTRAMUSCULAR | Status: AC
Start: 2016-04-30 — End: 2016-04-30

## 2016-04-30 MED ORDER — TRIAMCINOLONE ACETONIDE 40 MG/ML IJ SUSP
40 | INTRAMUSCULAR | Status: AC
Start: 2016-04-30 — End: 2016-04-30

## 2016-04-30 MED ORDER — BUPIVACAINE HCL (PF) 0.25 % IJ SOLN
0.25 % | Freq: Once | INTRAMUSCULAR | Status: AC | PRN
Start: 2016-04-30 — End: 2016-04-30
  Administered 2016-04-30: 19:00:00 5 via EPIDURAL

## 2016-04-30 MED ORDER — LIDOCAINE-EPINEPHRINE 1 %-1:200000 IJ SOLN
1 percent-:200000 | Freq: Once | INTRAMUSCULAR | Status: AC | PRN
Start: 2016-04-30 — End: 2016-04-30
  Administered 2016-04-30: 19:00:00 2 via EPIDURAL

## 2016-04-30 MED ORDER — BUPIVACAINE HCL (PF) 0.5 % IJ SOLN
0.5 % | Freq: Once | INTRAMUSCULAR | Status: AC | PRN
Start: 2016-04-30 — End: 2016-04-30
  Administered 2016-04-30: 19:00:00 4.5 via INTRA_ARTICULAR

## 2016-04-30 MED ORDER — SODIUM CHLORIDE 0.9 % IJ SOLN
0.9 % | Freq: Once | INTRAMUSCULAR | Status: AC | PRN
Start: 2016-04-30 — End: 2016-04-30
  Administered 2016-04-30: 19:00:00 4

## 2016-04-30 MED ORDER — IOHEXOL 240 MG/ML IJ SOLN
240 MG/ML | Freq: Once | INTRAMUSCULAR | Status: AC | PRN
Start: 2016-04-30 — End: 2016-04-30
  Administered 2016-04-30: 19:00:00 2 via EPIDURAL

## 2016-04-30 MED ORDER — SODIUM CHLORIDE 0.9 % IJ SOLN
0.9 | INTRAMUSCULAR | Status: AC
Start: 2016-04-30 — End: 2016-04-30

## 2016-04-30 MED ORDER — METHYLPREDNISOLONE ACETATE 80 MG/ML IJ SUSP
80 MG/ML | Freq: Once | INTRAMUSCULAR | Status: AC | PRN
Start: 2016-04-30 — End: 2016-04-30
  Administered 2016-04-30: 19:00:00 80 via EPIDURAL

## 2016-04-30 MED ORDER — TIZANIDINE HCL 4 MG PO TABS
4 | ORAL_TABLET | Freq: Two times a day (BID) | ORAL | 2 refills | Status: DC | PRN
Start: 2016-04-30 — End: 2017-12-09

## 2016-04-30 MED ORDER — TRIAMCINOLONE ACETONIDE 40 MG/ML IJ SUSP
40 MG/ML | Freq: Once | INTRAMUSCULAR | Status: AC | PRN
Start: 2016-04-30 — End: 2016-04-30
  Administered 2016-04-30: 19:00:00 20

## 2016-04-30 MED ORDER — LIDOCAINE HCL (PF) 1 % IJ SOLN
1 % | Freq: Once | INTRAMUSCULAR | Status: AC | PRN
Start: 2016-04-30 — End: 2016-04-30
  Administered 2016-04-30: 19:00:00 3 via SUBCUTANEOUS

## 2016-04-30 MED FILL — SODIUM CHLORIDE 0.9 % IJ SOLN: 0.9 % | INTRAMUSCULAR | Qty: 10

## 2016-04-30 MED FILL — KENALOG 40 MG/ML IJ SUSP: 40 MG/ML | INTRAMUSCULAR | Qty: 1

## 2016-04-30 MED FILL — SENSORCAINE-MPF 0.25 % IJ SOLN: 0.25 % | INTRAMUSCULAR | Qty: 10

## 2016-04-30 MED FILL — DEPO-MEDROL 80 MG/ML IJ SUSP: 80 MG/ML | INTRAMUSCULAR | Qty: 1

## 2016-04-30 MED FILL — MARCAINE PRESERVATIVE FREE 0.5 % IJ SOLN: 0.5 % | INTRAMUSCULAR | Qty: 30

## 2016-04-30 NOTE — Discharge Instructions (Signed)
Dr. Claudie Revering  Discharge Instructions / Information        You have had the procedure(s) called:    []  Cervical Epidural     [x]  Lumbar Epidural     []  _____________Injection  []  Cervical Facets  []  Lumbar Facets []  Radiofrequency Lesioning  []  Occipital Nerve Blocks []  Caudal Epidural []  Transforaminal Epidural  []  Trigger Point Injections [x]  SI Joint Injection []  ______________________    Please follow these instructions carefully.      If you have questions or problems you may call [phone].     You have received the following medications:  [x]  Lidocaine [x]  Bupivicaine: []  0.50%   [x]  0.25% [x]  DepoMedrol   [x]  Normal Saline  []  Versed []  Sodium Bicarbonate   []  Isovue   []  Botox  [x]  Kenalog   []  Diprovan  [x]  _____OMNL________________    PATIENT INFORMATION:  You may experience the following symptoms after your procedure.  These symptoms are normal and should not cause alarm.     An increase in our pain may occurr.  This may last 24-48 hours after your procedure.   No change in pain.   Weakness or numbness in your affected extremity.  This will usually only last a few hours.    REMEMBER TO REPORT THE FOLLOWING SYMPTOMS TO YOUR DOCTOR:   Redness, swelling. Or drainage at the injection site.   Unusual pain that interferes with your usual activities of daily living.    OTHER INSTRUCTIONS:  [x]  I will apply ice to the injection site for 24 hours.  Apply 15-2- minutes and take off 40 minutes.  [x]  I understand if a steroid was used it will take effect in 3-5 days.  [x]  I have received my personal belongings and valuable.  [x]  I have received a copy of my discharge instructions and understand to my satisfaction.  [x]  I will not drive for [x]  12 hours    []  24 hours after my injections.  [x]  I understand that today I will  []  rest  [x]  walk and move freely  []  other _________________  []  Additional instructions:  ____________________________________________________________________________        Patient  Discharge:  [x]  Home  []  Hospital  []  Other  ___________________________________    How:  []  Ambulatory  [x]  Wheelchair   []  Stretcher   []  __________________________________    Accompanied by:  [x]  Family Member  []  Friend  []  Hospital Staff  []  Ambulance Staff    []  Other_____________________________

## 2016-04-30 NOTE — Progress Notes (Shared)
Procedure:  Level of Consciousness: [x] Alert [x] Oriented [] Disoriented [] Lethargic  Anxiety Level: [x] Calm [] Anxious [] Depressed [] Other  Skin: [x] Warm [x] Dry [] Cool [] Moist [] Intact [] Other  Cardiovascular: [] Palpitations: [x] Never [] Occasionally [] Frequently  Chest Pain: [x] No [] Yes  Respiratory:  [x] Unlabored [] Labored [] Cough ([]  Productive [] Unproductive)  HCG Required: [x] No [] Yes   Results: [] Negative [] Positive  Knowledge Level:        [x] Patient/Other verbalized understanding of pre-procedure instructions.        [x] Assessment of post-op care needs (transportation, responsible caregiver)        [x] Able to discuss health care problems and how to deal with it.  Factors that Affect Teaching:        Language Barrier: [x] No [] Yes - why:        Hearing Loss:        [x] No [] Yes            Corrective Device:  [] Yes [] No        Vision Loss:           [] No [x] Yes            Corrective Device:  [x] Yes [] No        Memory Loss:       [x] No [] Yes            [] Short Term [] Long Term  Motivational Level:  [x] Asks Questions                  [] Extremely Anxious       [x] Seems Interested               [] Seems Uninterested                  [x] Denies need for Education  Risk for Injury:  [x] Patient oriented to person, place and time  [] History of frequent falls/loss of balance  Nutritional:  [] Change in appetite   [] Weight Gain   [] Weight Loss  Functional:  [] Requires assistance with ADL'sNursing Admission Record    Current Issues / Falls / ER Visits:  No    Percentage of Pain Relief after Last Procedure:  90 %    How long lasted:  5 months    Radiology exams received during the last 12 months: Yes       When July                                              Where-[facility]       Imaging on chart: Yes         Imaging records requested: Yes  MRI exams received in the past 2 years:  No  Physical therapy during the last 6 months: No       Labs during the last 12 months: Yes    Education Provided:  [x]  Review of [place]  [x]  Agreement  Review  []  Compliance Issues Discussed    []  Cognitive Behavior Needs [x]  Exercise []  Review of Test []  Financial Issues  []  Tobacco/Alcohol Use [x]  Teaching []  New Patient []  Picture Obtained    Physician Plan:  []  Outgoing Referral  []  Pharmacy Consult  []  Test Ordered   []  Obtained Test Results / Consult Notes  []  UDS due at next visit, verified per EPIC      []  Suspected Physical Abuse or Suicide Risk assessed - IF YES COMPLETE QUESTIONS BELOW    If any  of the following questions are answered yes - contact attending physician for referral:    Has been considering harming self to escape stress, pain problems?  []  YES  []  NO  Has a suicide plan? []  YES  []  NO  Has attempted suicide in the past?   []  YES  []  NO  Has a close friend or family member who committed suicide?  []  YES  []  NO    Patient Referred To :     Additional Notes:    Assessment Completed by:  Electronically signed by Newt Lukes, RN on 04/30/2016 at 1:51 PM

## 2016-04-30 NOTE — Procedures (Signed)
DATE: 04/30/2016      REASON FOR VISIT: Lumbar Degenerative Disc Disease,Lumbar Radiculopathy  PROCEDURE: Lumbar Epidural Steroid Injection.   []  Moderate Sedation    DESCRIPTION OF PROCEDURE:              DESCRIPTION OF PROCEDURE:              After obtaining informed consent, the patient was taken to the procedure room, positioned prone, and sterilely prepped.  The procedure was performed under fluoroscopic guidance.  First 5 ml of 1% Xylocaine was used at L5-S1 for local anesthesia.  A 19-gauge Hustead needle was advanced to the epidural space.  The was confirmed on the fluoroscope with an injection of [x]  2ml   Contrast.  Then, [x]  5ml of 0.25% Marcaine, [x]  4ml of Normal Saline, and [x]  80 mg of Depo Medrol was gently injected.          There were no complications.    DIAGNOSES:  []  Low Back Pain  [x]  *Lumbar Radiculopathy  [x]  *Lumbar Degenerative Disc Disease  []  *Lumbar Spinal Stenosis  []  *Lumbar Postlaminectomy Syndrome  []  Other        REASON FOR VISIT: Bilateral Sacroilitis  PROCEDURE:  Sacroiliac Joint Injection.    []  Moderate Sedation    DESCRIPTION OF PROCEDURE:    After obtaining informed consent, the patient was taken to the procedure room, positioned prone, and sterilely prepped.  The fluoroscope was positioned for guidance.  Fluoroscopic imaging confirmation, of intra-articular needle positioning, was confirmed within the [x]  Left  sacroiliac joint.  Then, 5ml of 0.5% Marcaine and 20 mg of Kenalog was gently injected.  There were no complications.  [x]  The fluoroscope was repositioned to the  [x]  Right and fluoroscopic imaging confirmation, of intra-articular needle positioning, was confirmed.  The 5ml of 0.5%Marcaine and 20 mg Kenalog was gently injected.      There were no complications.    DIAGNOSES:  [x]  *Sacroiliitis  []  Left []  Right [x]  Bilateral  []  Unspecified Arthropathy involving pelvic region and thigh.      Procedure:  Level of Consciousness: [x] Alert [x] Oriented [] Disoriented  [] Lethargic  Anxiety Level: [x] Calm [] Anxious [] Depressed [] Other  Skin: [x] Warm [x] Dry [] Cool [] Moist [] Intact [] Other  Cardiovascular: [] Palpitations: [x] Never [] Occasionally [] Frequently  Chest Pain: [x] No [] Yes  Respiratory:  [x] Unlabored [] Labored [] Cough ([]  Productive [] Unproductive)  HCG Required: [x] No [] Yes   Results: [] Negative [] Positive  Knowledge Level:        [x] Patient/Other verbalized understanding of pre-procedure instructions.        [x] Assessment of post-op care needs (transportation, responsible caregiver)        [x] Able to discuss health care problems and how to deal with it.  Factors that Affect Teaching:        Language Barrier: [x] No [] Yes - why:        Hearing Loss:        [x] No [] Yes            Corrective Device:  [] Yes [] No        Vision Loss:           [] No [x] Yes            Corrective Device:  [x] Yes [] No        Memory Loss:       [x] No [] Yes            [] Short Term [] Long Term  Motivational Level:  [x] Asks Questions                  []   Extremely Anxious       [x] Seems Interested               [] Seems Uninterested                  [x] Denies need for Education  Risk for Injury:  [x] Patient oriented to person, place and time  [] History of frequent falls/loss of balance  Nutritional:  [] Change in appetite   [] Weight Gain   [] Weight Loss  Functional:    Nursing Admission Record  Current Issues / Falls / ER Visits:  No  Percentage of Pain Relief after Last Procedure:  90 %    How long lasted:  5 months  Radiology exams received during the last 12 months: Yes       When July                                              Where-[facility]       Imaging on chart: Yes         Imaging records requested: Yes  MRI exams received in the past 2 years:  No  Physical therapy during the last 6 months: No  Labs during the last 12 months: Yes      COMMENTS:  Patient feels that he gained 90% relief that has lasted him 5 months with last injections. Discussed that if patient develops worsening symptoms will  refer for further imaging. Discussed the risk and benefits of procedure with patient. Will proceed today with Lumbar Epidural Steroid Injections.      PLAN:  []  Will return to office in 1-2 months for RFL  [x]  Planned Procedure- 3 months []  Office Visit  []  Prescription were given today    []  No prescriptions needed today  []  Patient is to call with any questions or concerns which may arise prior to the next office visit.                                []  Over 50% of today's appointment was given to discussion, evaluation and counseling.

## 2016-05-05 MED ORDER — ESOMEPRAZOLE MAGNESIUM 40 MG PO CPDR
40 MG | ORAL_CAPSULE | ORAL | 3 refills | Status: DC
Start: 2016-05-05 — End: 2016-05-26

## 2016-05-08 NOTE — Telephone Encounter (Signed)
Telephone Outcome: Other - patient refused CALLED TO SCHEDULE MEDICARE ANNUAL WELLNESS VISIT.

## 2016-05-26 NOTE — Patient Instructions (Addendum)
Followup With APRN 6 mo  Labs today- CBC, CMP, LIPIDs  Consider one of the injectable cholesterol meds- initiate paper work    Call with any questions or concerns  Follow up with [name], MD for non cardiac problems  Report any new problems  Cardiovascular Fitness-Exercise as tolerated.  Strive for 15 minutes of exercise most days of the week.    Cardiac / Healthy Diet  Continue current medications as directed  Continue plan of treatment  It is always recommended that you bring your medications bottles with you to each visit - this is for your safety!          Learning About Coronary Artery Disease (CAD)  What is coronary artery disease?    Coronary artery disease (CAD) occurs when plaque builds up in the arteries that bring oxygen-rich blood to your heart. Plaque is a fatty substance made of cholesterol, calcium, and other substances in the blood. This process is called hardening of the arteries, or atherosclerosis.  What happens when you have coronary artery disease?   Plaque may narrow the coronary arteries. Narrowed arteries cause poor blood flow. This can lead to angina symptoms such as chest pain or discomfort. If blood flow is completely blocked, you could have a heart attack.   You can slow CAD and reduce the risk of future problems by making changes in your lifestyle. These include quitting smoking and eating heart-healthy foods.   Treatments for CAD, along with changes in your lifestyle, can help you live a longer and healthier life.  How can you prevent coronary artery disease?   Do not smoke. It may be the best thing you can do to prevent heart disease. If you need help quitting, talk to your doctor about stop-smoking programs and medicines. These can increase your chances of quitting for good.   Be active. Get at least 30 minutes of exercise on most days of the week. Walking is a good choice. You also may want to do other activities, such as running, swimming, cycling, or playing tennis or  team sports.   Eat heart-healthy foods. Eat more fruits and vegetables and less foods that contain saturated and trans fats. Limit alcohol, sodium, and sweets.   Stay at a healthy weight. Lose weight if you need to.   Manage other health problems such as diabetes, high blood pressure, and high cholesterol.   Manage stress. Stress can hurt your heart. To keep stress low, talk about your problems and feelings. Don't keep your feelings hidden.   If you have talked about it with your doctor, take a low-dose aspirin every day. Aspirin can help certain people lower their risk of a heart attack or stroke. But taking aspirin isn't right for everyone, because it can cause serious bleeding. Do not start taking daily aspirin unless your doctor knows about it.  How is coronary artery disease treated?   Your doctor will suggest that you make lifestyle changes. For example, your doctor may ask you to eat healthy foods, quit smoking, lose extra weight, and be more active.   You will have to take medicines.   Your doctor may suggest a procedure to open narrowed or blocked arteries. This is called angioplasty. Or your doctor may suggest using healthy blood vessels to create detours around narrowed or blocked arteries. This is called bypass surgery.  Follow-up care is a key part of your treatment and safety. Be sure to make and go to all appointments, and call your doctor  if you are having problems. It's also a good idea to know your test results and keep a list of the medicines you take.  Where can you learn more?  Go to [email or web address] and sign in to your MyChart account. Enter C643 in the Search Health Information box to learn more about "Learning About Coronary Artery Disease (CAD)."     If you do not have an account, please click on the "Sign Up Now" link.  Current as of: August 22, 2015  Content Version: 11.3   2006-2017 [employer]. Care instructions adapted under license by  [facility]. If you have questions about a medical condition or this instruction, always ask your healthcare professional. [employer] disclaims any warranty or liability for your use of this information.

## 2016-05-26 NOTE — Progress Notes (Signed)
[facility], [place]  [address], [place]  [address]  Phone: [phone]  Fax: [phone]    OFFICE VISIT:  05/26/2016    Richard Sims - DOB: 1951-01-27    Reason For Visit:  Richard Sims is a 65 y.o. male who is here for 6 Month Follow-Up (denies any cardiac symptoms. ); Coronary Artery Disease (patient had stress test on 11/19/15. ); and Hyperlipidemia (patient labs were ordered.)    HPI  Patient's main complaint today is leg cramps related to statin therapy. He has tried multiple statins including Pravachol, Crestor, and simvastatin  Coronary Artery Disease   Presents for follow-up visit. Pertinent negatives include no chest pain, chest pressure, chest tightness, dizziness, leg swelling, muscle weakness, palpitations, shortness of breath or weight gain. The symptoms have been stable. Compliance with diet is good. Compliance with exercise is good. Compliance with medications is good.        Richard Bolus, MD is PCP.  Richard Sims has the following history as recorded in EpicCare:    Patient Active Problem List    Diagnosis Date Noted   . Statin intolerance 05/26/2016   . Myalgia 05/26/2016   . Lumbar disc disease with radiculopathy 04/30/2016   . Lumbar disc disease with radiculopathy 11/15/2015   . Lumbar disc disease with radiculopathy 08/15/2015   . Lumbar disc disease with radiculopathy 05/16/2015   . H/O right coronary artery stent placement 05/09/2015   . Lumbar disc disease with radiculopathy 01/15/2015   . Low back pain radiating to left leg 09/11/2014   . Lumbar degenerative disc disease 08/11/2014   . Lumbar radiculopathy 08/11/2014   . Chest pain 12/09/2011   . Shortness of breath 12/09/2011   . S/P CABG x 1    . CAD (coronary artery disease)    . Hyperlipidemia    . Hypertension      Past Medical History:   Diagnosis Date   . CAD (coronary artery disease)    . Cervical disc disease    . GERD (gastroesophageal reflux disease)    . Hyperlipidemia     [facility] his lipid/liver panel   . Hypertension    . Low back pain radiating to left leg 09/11/2014   . Lumbar degenerative disc disease 08/11/2014   . Lumbar radiculopathy 08/11/2014   . Myalgia 05/26/2016   . Nephrolithiasis    . S/P CABG x 1     MICS with LIMA to LAD via left mini thoracotomy   . Statin intolerance 05/26/2016     Past Surgical History:   Procedure Laterality Date   . CARDIAC CATHETERIZATION  05/29/11   [facility]    EF  over 60%   . CARDIAC CATHETERIZATION  12/12/2011  [facility]    with PTCA, stent to LAD   . CORONARY ARTERY BYPASS GRAFT  06/02/2011     HYBRID MICS CABG, LT THORACOTOMY APPROACH, LIMA-LAD, PCI DES-RCA   . JOINT REPLACEMENT      LEFT KNEE   . KNEE ARTHROSCOPY      BILATERAL   . SPINE SURGERY      ACF X 2     Family History   Problem Relation Age of Onset   . Heart Disease Father    . High Blood Pressure Father    . High Cholesterol Father    . High Blood Pressure Mother    . Depression Paternal Grandmother      Social  History   Substance Use Topics   . Smoking status: Never Smoker   . Smokeless tobacco: Never Used   . Alcohol use Yes      Comment: rarely      Current Outpatient Prescriptions   Medication Sig Dispense Refill   . metoprolol tartrate (LOPRESSOR) 25 MG tablet Take 12.5 mg by mouth 2 times daily     . tiZANidine (ZANAFLEX) 4 MG tablet Take 1 tablet by mouth 2 times daily as needed (one month supply) 45 tablet 2   . pravastatin (PRAVACHOL) 40 MG tablet TAKE 1 TABLET BY MOUTH DAILY 90 tablet 5   . celecoxib (CELEBREX) 200 MG capsule Take 1 capsule by mouth 2 times daily 180 capsule 1   . esomeprazole Magnesium (NEXIUM) 40 MG PACK Take 40 mg by mouth as needed     . RAPAFLO 8 MG CAPS 8 mg every evening      . nitroGLYCERIN (NITROSTAT) 0.4 MG SL tablet Place 1 tablet under the tongue every 5 minutes as needed for Chest pain up to 3 doses, in no relief report to the ER. 25 tablet 3   . dutasteride (AVODART) 0.5 MG capsule Take 0.5 mg by mouth daily.     Marland Kitchen aspirin 81 MG EC  tablet Take 81 mg by mouth daily.         No current facility-administered medications for this visit.      Allergies: Crestor [rosuvastatin] and Simvastatin    Review of Systems  Review of Systems   Constitutional: Negative for chills, fever, malaise/fatigue and weight gain.   Eyes: Negative for blurred vision.   Respiratory: Negative for cough, chest tightness and shortness of breath.    Cardiovascular: Negative for chest pain, palpitations, orthopnea, leg swelling and PND.   Gastrointestinal: Negative for heartburn, nausea and vomiting.   Musculoskeletal: Positive for myalgias. Negative for falls and muscle weakness.   Skin: Negative for rash.   Neurological: Negative for dizziness, sensory change, speech change, focal weakness and headaches.   Endo/Heme/Allergies: Does not bruise/bleed easily.   Psychiatric/Behavioral: Negative for depression. The patient is not nervous/anxious.        Objective  Vital Signs - BP 128/80  Pulse 76  Ht 5\' 7"  (1.702 m)  Wt 189 lb (85.7 kg)  BMI 29.6 kg/m2  Physical Exam   Constitutional: He is oriented to person, place, and time. He appears well-developed and well-nourished. No distress.   HENT:   Head: Normocephalic and atraumatic.   Eyes: Pupils are equal, round, and reactive to light. Right eye exhibits no discharge. Left eye exhibits no discharge.   Neck: No JVD present. No tracheal deviation present.   Cardiovascular: Normal rate, regular rhythm, normal heart sounds and intact distal pulses.  Exam reveals no gallop and no friction rub.    No murmur heard.  No carotid bruit   Pulmonary/Chest: Effort normal and breath sounds normal. No respiratory distress. He has no wheezes. He has no rales.   Abdominal: Soft. There is no tenderness.   Musculoskeletal: He exhibits no edema.   Normal gait and station   Neurological: He is alert and oriented to person, place, and time. No cranial nerve deficit.   Skin: Skin is warm and dry. No rash noted.   Psychiatric: He has a normal mood  and affect. His behavior is normal. Judgment normal.   Nursing note and vitals reviewed.      Assessment:    1. Coronary artery disease involving native coronary  artery of native heart without angina pectoris     2. Mixed hyperlipidemia  Lipid Panel    CBC    Comprehensive Metabolic Panel   3. Essential hypertension     4. Laboratory exam ordered as part of routine general medical examination  Lipid Panel    CBC    Comprehensive Metabolic Panel   5. Statin intolerance     6. Myalgia       Patient is taking medications as prescribed    Patient is having myalgias related to his statin therapy. He is currently on pravastatin. He has tried Crestor and simvastatin as well the past with similar issues. He is interested in trying with the injectable cholesterol medications. Information provided about Praluent. Plan will be to obtain lipids today as patient is fasting. He also requests his other routine blood work-I will add a CBC and CMP.    Stable cardiovascular status. No evidence of overt heart failure, angina or dysrhythmia.     Plan    Orders Placed This Encounter   Procedures   . Lipid Panel     Standing Status:   Future     Standing Expiration Date:   05/26/2017     Order Specific Question:   Is Patient Fasting?/# of Hours     Answer:   yes   . CBC     Standing Status:   Future     Standing Expiration Date:   05/26/2017   . Comprehensive Metabolic Panel     Standing Status:   Future     Standing Expiration Date:   05/26/2017     Followup With APRN 6 Sims  Labs today- CBC, CMP, LIPIDs  Consider one of the injectable cholesterol meds- initiate paper work today and obtain labs    Call with any questions or concerns  Follow up with Richard Bolus, MD for non cardiac problems  Report any new problems  Cardiovascular Fitness-Exercise as tolerated.  Strive for 15 minutes of exercise most days of the week.    Cardiac / Healthy Diet  Continue current medications as directed  Continue plan of treatment  It is always recommended  that you bring your medications bottles with you to each visit - this is for your safety!       Renaldo Fiddler, APRN

## 2016-06-16 ENCOUNTER — Encounter: Attending: Clinical Nurse Specialist | Primary: Family Medicine

## 2016-06-18 ENCOUNTER — Inpatient Hospital Stay: Admit: 2016-06-18 | Discharge: 2016-06-18 | Payer: MEDICARE | Attending: Family | Primary: Family Medicine

## 2016-06-18 DIAGNOSIS — M5116 Intervertebral disc disorders with radiculopathy, lumbar region: Secondary | ICD-10-CM

## 2016-06-18 MED ORDER — HYDROCODONE-ACETAMINOPHEN 5-325 MG PO TABS
5-325 MG | ORAL_TABLET | ORAL | 0 refills | Status: DC
Start: 2016-06-18 — End: 2016-08-13

## 2016-06-18 NOTE — Progress Notes (Signed)
[facility]  Patient Pain Assessment  Progress Note       Chief Complaint   Patient presents with   ??? Lower Back Pain     radiates down left leg     Pain Assessment  Pain Assessment: 0-10  Pain Level: 3  Pain Type: Chronic pain  Pain Location: Back, Leg  Pain Orientation: Lower  Pain Radiating Towards: down buttocks and left leg  Pain Descriptors: Cramping  Pain Frequency: Intermittent  Pain Onset: On-going  Clinical Progression: Not changed  Effect of Pain on Daily Activities: limits activities       []   Issues of Concern:    Reports he has extra pain on occasional days, not currently taking any pain medication; discussed adding low dose of Norco. Patient and wife discuss this option and agree to trying Norco 5/325mg  PRN, #30/month. Reports taking Zanaflex intermittently for muscle spasms.     [x]   Reports current medication is helping, but continues to have on-going pain    [x]   Reports current pain medication increases ability to do activities of daily living     [x]   Current narcotic medications    [x]   Discussed possible medication side effects, risk of tolerance and/or dependence, alternative treatments    [x]   Encouraged to set goals of decreasing daily narcotic intake    [x]   Discussed effects of long term narcotic use      [x]   Injection options discussed     [] Yes [x] No  Current medication side effects, comment if applicable:      [] Yes [x] No   Acute bladder or bowel changes    Previous Procedure / Percentage of pain control / Imaging / PT History:  Percentage of Pain Relief after Last Procedure:  100 %    How long lasted:  1.5 months  ??  Radiology exams received during the last 12 months: Lumbar xray  When June 2017                                              Where [facility]  Imaging on chart: Yes   MRI exams received in the past 2 years: No  Physical therapy during the last 6 months: No     BMI: Body mass index is 30.07 kg/m??.    [x]   Nutrient rich, low fat, low carbohydrate diet  discussed   [x]   Positive effect of weight management on pain control discussed    Activity:   [x]  Exercise discussed as beneficial to pain reduction, encouraged stretching exercises and to set daily goals    Tobacco use:    [x]  Never    Social History     Social History   ??? Marital status: Married     Spouse name: N/A   ??? Number of children: N/A   ??? Years of education: N/A     Social History Main Topics   ??? Smoking status: Never Smoker   ??? Smokeless tobacco: Never Used   ??? Alcohol use Yes      Comment: rarely   ??? Drug use: No   ??? Sexual activity: Yes     Partners: Female     Other Topics Concern   ??? None     Social History Narrative   ??? None  Past Medical History:       Diagnosis Date   ??? CAD (coronary artery disease)    ??? Cervical disc disease    ??? GERD (gastroesophageal reflux disease)    ??? Hyperlipidemia     [facility] manage his lipid/liver panel   ??? Hypertension    ??? Low back pain radiating to left leg 09/11/2014   ??? Lumbar degenerative disc disease 08/11/2014   ??? Lumbar radiculopathy 08/11/2014   ??? Myalgia 05/26/2016   ??? Nephrolithiasis    ??? S/P CABG x 1     MICS with LIMA to LAD via left mini thoracotomy   ??? Statin intolerance 05/26/2016     Surgical History:  Past Surgical History:   Procedure Laterality Date   ??? [facility]    EF  over 60%   ??? [facility]    with [facility], stent to LAD   ??? CORONARY ARTERY BYPASS GRAFT  06/02/2011     HYBRID MICS CABG, LT THORACOTOMY APPROACH, LIMA-LAD, PCI DES-RCA   ??? JOINT REPLACEMENT      LEFT KNEE   ??? KNEE ARTHROSCOPY      BILATERAL   ??? SPINE SURGERY      ACF X 2     Family History:  family history includes Depression in his paternal grandmother; Heart Disease in his father; High Blood Pressure in his father and mother; High Cholesterol in his father.     Allergies:  Crestor [rosuvastatin] and Simvastatin     Medications:  Current Outpatient  Prescriptions   Medication Sig Dispense Refill   ??? metoprolol tartrate (LOPRESSOR) 25 MG tablet Take 12.5 mg by mouth 2 times daily     ??? tiZANidine (ZANAFLEX) 4 MG tablet Take 1 tablet by mouth 2 times daily as needed (one month supply) 45 tablet 2   ??? pravastatin (PRAVACHOL) 40 MG tablet TAKE 1 TABLET BY MOUTH DAILY 90 tablet 5   ??? celecoxib (CELEBREX) 200 MG capsule Take 1 capsule by mouth 2 times daily 180 capsule 1   ??? esomeprazole Magnesium (NEXIUM) 40 MG PACK Take 40 mg by mouth as needed     ??? RAPAFLO 8 MG CAPS 8 mg every evening      ??? nitroGLYCERIN (NITROSTAT) 0.4 MG SL tablet Place 1 tablet under the tongue every 5 minutes as needed for Chest pain up to 3 doses, in no relief report to the ER. 25 tablet 3   ??? dutasteride (AVODART) 0.5 MG capsule Take 0.5 mg by mouth daily.     ??? aspirin 81 MG EC tablet Take 81 mg by mouth daily.         No current facility-administered medications for this encounter.           Vitals:  BP 138/76    Pulse 62    Temp 97.2 ??F (36.2 ??C) (Temporal)    Resp 18    Ht 5\' 7"  (1.702 m)    Wt 192 lb (87.1 kg)    SpO2 95%    BMI 30.07 kg/m??       Physical Exam:  General appearance: no acute distress  Head: NCAT, EOMI  Skin: Warm, Dry   Musculoskeletal: ambulatory per self, steady gait  Neurologic: alert and oriented X 3, speech clear  Mood and affect: appropriate, no SI or HI    Assessment:    *      Lumbar DDD  *  Lumbar Radiculopathy  *      Sacroiliitis, bilateral    Plan:   [x]   Patient is to call with any questions or concerns which may arise prior to the next office visit    [x]   Continue current medications per our office, see medication tab, KASPER reviewed   [x]   Add Norco 5/325mg  1-2 tabs daily, PRN, pain, #30/month   []   Imaging order given to patient   []   PT order given to patient   [x]   Procedure scheduled for next visit, see encounter details   []   UDS done today   []   UDS next visit   []   ...    Controlled Substance Monitoring: Discussed with patient possible  medication side effects, risk of tolerance and/or dependence, and alternative treatments. Discussed the effects of long term narcotic use. Patient encouraged to set daily goals of exercising and decreasing daily narcotic intake.      Electronically signed by Donnetta Hail, APRN

## 2016-06-18 NOTE — Progress Notes (Signed)
Nursing Admission Record    Current Issues / Falls / ER Visits:  No    Percentage of Pain Relief after Last Procedure:  100 %    How long lasted:  1.5 months    Radiology exams received during the last 12 months: Yes Lumbar xray       When June 2017                                              Where [facility]       Imaging on chart: Yes         Imaging records requested: No  MRI exams received in the past 2 years:  No  Physical therapy during the last 6 months: No       When: na                                             Where na  Labs during the last 12 months: Yes with Dr. Alvina Chou    Education Provided:  [x]  Review of Zadie Rhine  []  Agreement Review  []  Compliance Issues Discussed    []  Cognitive Behavior Needs [x]  Exercise []  Review of Test []  Financial Issues  []  Tobacco/Alcohol Use [x]  Teaching []  New Patient []  Picture Obtained    Physician Plan:  []  Outgoing Referral  []  Pharmacy Consult  []  Test Ordered   []  Obtained Test Results / Consult Notes  []  UDS due at next visit, verified per EPIC      []  Suspected Physical Abuse or Suicide Risk assessed - IF YES COMPLETE QUESTIONS BELOW    If any of the following questions are answered yes - contact attending physician for referral:    Has been considering harming self to escape stress, pain problems?  []  YES  []  NO  Has a suicide plan? []  YES  []  NO  Has attempted suicide in the past?   []  YES  []  NO  Has a close friend or family member who committed suicide?  []  YES  []  NO    Patient Referred To :     Additional Notes:    Assessment Completed by:  Electronically signed by Erroll Luna, RN on 06/18/2016 at 2:50 PM

## 2016-06-19 NOTE — Telephone Encounter (Signed)
Patient's spouse called and advised she was told medication had been approved and should be hearing from the pharmacy.  She advised it's been a week and has not heard anything yet and was instructed to call back and we would help her.

## 2016-06-19 NOTE — Telephone Encounter (Signed)
Called Richard Sims back.I left her a voicemail to call me back and told her that I called [facility] will be contacting her tomorrow.

## 2016-08-11 ENCOUNTER — Inpatient Hospital Stay: Admit: 2016-08-11 | Discharge: 2016-08-11 | Payer: MEDICARE | Attending: Pain Medicine | Primary: Family Medicine

## 2016-08-11 ENCOUNTER — Inpatient Hospital Stay: Admit: 2016-08-11 | Discharge: 2016-08-12 | Payer: MEDICARE | Primary: Family Medicine

## 2016-08-11 ENCOUNTER — Encounter

## 2016-08-11 DIAGNOSIS — M5116 Intervertebral disc disorders with radiculopathy, lumbar region: Secondary | ICD-10-CM

## 2016-08-11 DIAGNOSIS — M5136 Other intervertebral disc degeneration, lumbar region: Secondary | ICD-10-CM

## 2016-08-11 MED ORDER — TRIAMCINOLONE ACETONIDE 40 MG/ML IJ SUSP
40 MG/ML | Freq: Once | INTRAMUSCULAR | Status: AC | PRN
Start: 2016-08-11 — End: 2016-08-11
  Administered 2016-08-11: 21:00:00 20 via INTRA_ARTICULAR

## 2016-08-11 MED ORDER — IOHEXOL 240 MG/ML IJ SOLN
240 MG/ML | Freq: Once | INTRAMUSCULAR | Status: AC | PRN
Start: 2016-08-11 — End: 2016-08-11
  Administered 2016-08-11: 21:00:00 2 via EPIDURAL

## 2016-08-11 MED ORDER — BUPIVACAINE HCL (PF) 0.25 % IJ SOLN
0.25 % | Freq: Once | INTRAMUSCULAR | Status: AC | PRN
Start: 2016-08-11 — End: 2016-08-11
  Administered 2016-08-11: 21:00:00 5 via EPIDURAL

## 2016-08-11 MED ORDER — SODIUM CHLORIDE 0.9 % IJ SOLN
0.9 % | Freq: Once | INTRAMUSCULAR | Status: AC | PRN
Start: 2016-08-11 — End: 2016-08-11
  Administered 2016-08-11: 21:00:00 4

## 2016-08-11 MED ORDER — METHYLPREDNISOLONE ACETATE 80 MG/ML IJ SUSP
80 | INTRAMUSCULAR | Status: AC
Start: 2016-08-11 — End: 2016-08-11

## 2016-08-11 MED ORDER — TRIAMCINOLONE ACETONIDE 40 MG/ML IJ SUSP
40 | INTRAMUSCULAR | Status: AC
Start: 2016-08-11 — End: 2016-08-11

## 2016-08-11 MED ORDER — SODIUM CHLORIDE 0.9 % IJ SOLN
0.9 | INTRAMUSCULAR | Status: AC
Start: 2016-08-11 — End: 2016-08-11

## 2016-08-11 MED ORDER — BUPIVACAINE HCL (PF) 0.5 % IJ SOLN
0.5 % | Freq: Once | INTRAMUSCULAR | Status: AC | PRN
Start: 2016-08-11 — End: 2016-08-11
  Administered 2016-08-11: 21:00:00 4.5 via INTRA_ARTICULAR

## 2016-08-11 MED ORDER — CELECOXIB 200 MG PO CAPS
200 | ORAL_CAPSULE | Freq: Two times a day (BID) | ORAL | 1 refills | Status: DC
Start: 2016-08-11 — End: 2017-02-04

## 2016-08-11 MED ORDER — BUPIVACAINE HCL (PF) 0.5 % IJ SOLN
0.5 | INTRAMUSCULAR | Status: AC
Start: 2016-08-11 — End: 2016-08-11

## 2016-08-11 MED ORDER — METHYLPREDNISOLONE ACETATE 80 MG/ML IJ SUSP
80 MG/ML | Freq: Once | INTRAMUSCULAR | Status: AC | PRN
Start: 2016-08-11 — End: 2016-08-11
  Administered 2016-08-11: 21:00:00 80 via EPIDURAL

## 2016-08-11 MED ORDER — LIDOCAINE HCL (PF) 1 % IJ SOLN
1 % | Freq: Once | INTRAMUSCULAR | Status: AC | PRN
Start: 2016-08-11 — End: 2016-08-11
  Administered 2016-08-11: 21:00:00 3 via SUBCUTANEOUS

## 2016-08-11 MED ORDER — LIDOCAINE-EPINEPHRINE 1 %-1:200000 IJ SOLN
1 percent-:200000 | Freq: Once | INTRAMUSCULAR | Status: AC | PRN
Start: 2016-08-11 — End: 2016-08-11
  Administered 2016-08-11: 21:00:00 2 via EPIDURAL

## 2016-08-11 MED FILL — MARCAINE PRESERVATIVE FREE 0.5 % IJ SOLN: 0.5 % | INTRAMUSCULAR | Qty: 30

## 2016-08-11 MED FILL — KENALOG 40 MG/ML IJ SUSP: 40 MG/ML | INTRAMUSCULAR | Qty: 1

## 2016-08-11 MED FILL — SODIUM CHLORIDE 0.9 % IJ SOLN: 0.9 % | INTRAMUSCULAR | Qty: 10

## 2016-08-11 MED FILL — DEPO-MEDROL 80 MG/ML IJ SUSP: 80 MG/ML | INTRAMUSCULAR | Qty: 1

## 2016-08-11 NOTE — Progress Notes (Signed)
Pt does not receive any pain medication from [place] currently.  He did receive a script from Dr.Wells and has filled it.  Explained to the pt it is ok as long as he only gets medications from one doctor.  Pt is aware.  Will send a script in for compound cream to [facility] per [employer].

## 2016-08-11 NOTE — Discharge Instructions (Signed)
PRE PROCEDURE INSTRUCTIONS    You must have a driver present in the office for your procedure to be done.    If you are having sedation with your procedure you must not have anything to eat or drink 4 hours prior to your appointment time.  If you need to take medications you may take with a small sip of water only.    Please arrive 30 minutes prior to your scheduled appointment time to register downstairs.Dr. Canary Brim  Discharge Instructions / Information        You have had the procedure(s) called:    '[]'  Cervical Epidural     '[x]'  Lumbar Epidural     '[]'  _____________Injection  '[]'  Cervical Facets  '[]'  Lumbar Facets '[]'  Radiofrequency Lesioning  '[]'  Occipital Nerve Blocks '[]'  Caudal Epidural '[]'  Transforaminal Epidural  '[]'  Trigger Point Injections '[x]'  SI Joint Injection '[]'  ______________________    Please follow these instructions carefully.      If you have questions or problems you may call [phone].     You have received the following medications:  '[x]'  Lidocaine '[x]'  Bupivicaine: '[]'  0.50%   '[]'  0.25% '[x]'  DepoMedrol   '[x]'  Normal Saline  '[]'  Versed '[]'  Sodium Bicarbonate   '[]'  Isovue   '[]'  Botox  '[x]'  Kenalog   '[]'  Diprovan  '[x]'  omnipaque_____________________    PATIENT INFORMATION:  You may experience the following symptoms after your procedure.  These symptoms are normal and should not cause alarm.     An increase in our pain may occurr.  This may last 24-48 hours after your procedure.   No change in pain.   Weakness or numbness in your affected extremity.  This will usually only last a few hours.    REMEMBER TO REPORT THE FOLLOWING SYMPTOMS TO YOUR DOCTOR:   Redness, swelling. Or drainage at the injection site.   Unusual pain that interferes with your usual activities of daily living.    OTHER INSTRUCTIONS:  '[x]'  I will apply ice to the injection site for 24 hours.  Apply 15-2- minutes and take off 40 minutes.  '[x]'  I understand if a steroid was used it will take effect in 3-5 days.  '[x]'  I have received my personal  belongings and valuable.  '[x]'  I have received a copy of my discharge instructions and understand to my satisfaction.  '[x]'  I will not drive for '[x]'  12 hours    '[]'  24 hours after my injections.  '[x]'  I understand that today I will  '[x]'  rest  '[]'  walk and move freely  '[]'  other _________________  '[]'  Additional instructions:  ____________________________________________________________________________        Patient Discharge:  '[x]'  Home  '[]'  Hospital  '[]'  Other  ___________________________________    How:  '[]'  Ambulatory  '[x]'  Wheelchair   '[]'  Stretcher   '[]'  __________________________________    Accompanied by:  '[x]'  Family Member  '[]'  Friend  '[]'  Hospital Staff  '[]'  Ambulance Staff    '[]'  Other_____________________________    Discharge criteria met.  Post procedure dressing dry and intact.  Sensory and motor function intact as per pre-procedure.  Instructions and follow up reviewed with pt at patient at discharge.

## 2016-08-11 NOTE — Progress Notes (Signed)
Nursing Admission Record    Current Issues / Falls / ER Visits:  No    Percentage of Pain Relief after Last Procedure:  80 %    How long lasted:  2 months    Radiology exams received during the last 12 months: Yes       MRI exams received in the past 2 years:  No  Physical therapy during the last 6 months: No      Labs during the last 12 months: Yes    Education Provided:  [x]  Review of [place]  []  Agreement Review  []  Compliance Issues Discussed    []  Cognitive Behavior Needs []  Exercise []  Review of Test []  Financial Issues  []  Tobacco/Alcohol Use [x]  Teaching []  New Patient []  Picture Obtained    Physician Plan:  []  Outgoing Referral  []  Pharmacy Consult  []  Test Ordered   []  Obtained Test Results / Consult Notes  []  UDS due at next visit, verified per EPIC      []  Suspected Physical Abuse or Suicide Risk assessed - IF YES COMPLETE QUESTIONS BELOW    If any of the following questions are answered yes - contact attending physician for referral:    Has been considering harming self to escape stress, pain problems?  []  YES  []  NO  Has a suicide plan? []  YES  []  NO  Has attempted suicide in the past?   []  YES  []  NO  Has a close friend or family member who committed suicide?  []  YES  []  NO    Patient Referred To :     Additional Notes:    Assessment Completed by:  Electronically signed by Yves Dill, RN on 08/11/2016 at 3:00 PM

## 2016-08-11 NOTE — Procedures (Signed)
DATE: 08/11/2016      REASON FOR VISIT: Lumbar Degenerative Disc Disease, Lumbar Radiculopathy   PROCEDURE: Lumbar Epidural Steroid Injection.   []  Moderate Sedation    DESCRIPTION OF PROCEDURE:            After obtaining informed consent, the patient was taken to the procedure room, positioned prone, and sterilely prepped. The procedure was performed under fluoroscopic guidance. First 5 ml of 1% Xylocainewas used at L5-S60for local anesthesia. A 19-gauge Hustead needle was advanced to the epidural space. The was confirmed on the fluoroscope with an injection of[x] 2ml Contrast. Then,[x] 5ml of 0.25% Marcaine, [x] 4ml of Normal Saline, and [x] 80 mg of DepoMedrolwas gently injected.     There were no complications.    DIAGNOSES:  []  Low Back Pain  [x]  *Lumbar Radiculopathy  [x]  *Lumbar Degenerative Disc Disease  []  *Lumbar Spinal Stenosis  []  *Lumbar Postlaminectomy Syndrome  []  Other  DATE: 08/11/2016      REASON FOR VISIT: Principal Problem:    Lumbar disc disease with radiculopathy        PROCEDURE:  Sacroiliac Joint Injection.    []  Moderate Sedation    DESCRIPTION OF PROCEDURE:    After obtaining informed consent, the patient was taken to the procedure room, positioned prone, and sterilely prepped.  The fluoroscope was positioned for guidance.  Fluoroscopic imaging confirmation, of intra-articular needle positioning, was confirmed within the [x]  Left []  Right sacroiliac joint.  Then, 5ml of 0.5% Marcaine and 20 mg of Kenalog was gently injected.  There were no complications.  [x]  The fluoroscope was repositioned to the []  Left [x]  Right and fluoroscopic imaging confirmation, of intra-articular needle positioning, was confirmed.  The 5ml of 0.5%Marcaine and 20 mg Kenalog was gently injected.      There were no complications.    DIAGNOSES:  [x]  *Sacroiliitis  []  Left []  Right [x]  Bilateral  []  Unspecified Arthropathy involving pelvic region and thigh.    PLAN:  []  Will return to office in 1-2 months  for RFL  []  Planned Procedure []  Office Visit  [x]  Prescription were given today    []  No prescriptions needed today  []  Patient is to call with any questions or concerns which may arise prior to the next office visit.    COMMENTS:          Beatrix Fetters, MD                    []  Over 50% of today's appointment was given to discussion, evaluation and counseling.  Procedure:  Level of Consciousness: [x] Alert [x] Oriented [] Disoriented [] Lethargic  Anxiety Level: [x] Calm [] Anxious [] Depressed [] Other  Skin: [x] Warm [x] Dry [] Cool [] Moist [] Intact [] Other  Cardiovascular: [x] Palpitations: [x] Never [] Occasionally [] Frequently  Chest Pain: [x] No [] Yes  Respiratory:  [x] Unlabored [] Labored [] Cough ([]  Productive [] Unproductive)  HCG Required: [x] No [] Yes   Results: [] Negative [] Positive  Knowledge Level:        [x] Patient/Other verbalized understanding of pre-procedure instructions.        [x] Assessment of post-op care needs (transportation, responsible caregiver)        [x] Able to discuss health care problems and how to deal with it.  Factors that Affect Teaching:        Language Barrier: [x] No [] Yes - why:        Hearing Loss:        [x] No [] Yes            Corrective Device:  [] Yes [x] No  Vision Loss:           [] No [x] Yes            Corrective Device:  [x] Yes [] No        Memory Loss:       [x] No [] Yes            [] Short Term [] Long Term  Motivational Level:  [x] Asks Questions                  [] Extremely Anxious       [x] Seems Interested               [] Seems Uninterested                  [] Denies need for Education  Risk for Injury:  [x] Patient oriented to person, place and time  [] History of frequent falls/loss of balance  Nutritional:  [] Change in appetite   [] Weight Gain   [] Weight Loss  Functional:      Nursing Admission Record  Current Issues / Falls / ER Visits:  No  Percentage of Pain Relief after Last Procedure:  80 %    How long lasted:  2 months  Radiology exams received during the last 12 months:  Yes  MRI exams received in the past 2 years:  No  Physical therapy during the last 6 months: No    Labs during the last 12 months: Yes        COMMENTS:  Patient complains of lower back pain that radiates down the left leg to the ankle. Patient states that he has numbness in that leg at times.  Patient feels that he gained 80% relief from last injections that lasted him 2 months.Patient feels that he is making strides with home exercises.   Discussed the risk and benefits of procedure with patient. Will proceed today with Lumbar Epidural Steroid Injection.         PLAN:  [x]  Will return to office in  3 month(s)for  []  Planned Procedure  []  Office Visit  []  Prescriptions were given today   []  No prescriptions needed today  [x]  Patient is to call with any questions or concerns which may arise prior to the next office visit.  [x]  LESI L5-S1, SIs.  []  LESI L4-5                            []  Over 50% of today's appointment was given to discussion, evaluation and counseling.

## 2016-08-11 NOTE — Progress Notes (Signed)
Procedure:  Level of Consciousness: [x] Alert [x] Oriented [] Disoriented [] Lethargic  Anxiety Level: [x] Calm [] Anxious [] Depressed [] Other  Skin: [x] Warm [x] Dry [] Cool [] Moist [] Intact [] Other  Cardiovascular: [x] Palpitations: [x] Never [] Occasionally [] Frequently  Chest Pain: [x] No [] Yes  Respiratory:  [x] Unlabored [] Labored [] Cough ([]  Productive [] Unproductive)  HCG Required: [x] No [] Yes   Results: [] Negative [] Positive  Knowledge Level:        [x] Patient/Other verbalized understanding of pre-procedure instructions.        [x] Assessment of post-op care needs (transportation, responsible caregiver)        [x] Able to discuss health care problems and how to deal with it.  Factors that Affect Teaching:        Language Barrier: [x] No [] Yes - why:        Hearing Loss:        [x] No [] Yes            Corrective Device:  [] Yes [x] No        Vision Loss:           [] No [x] Yes            Corrective Device:  [x] Yes [] No        Memory Loss:       [x] No [] Yes            [] Short Term [] Long Term  Motivational Level:  [x] Asks Questions                  [] Extremely Anxious       [x] Seems Interested               [] Seems Uninterested                  [] Denies need for Education  Risk for Injury:  [x] Patient oriented to person, place and time  [] History of frequent falls/loss of balance  Nutritional:  [] Change in appetite   [] Weight Gain   [] Weight Loss  Functional:  [] Requires assistance with ADL's

## 2016-08-13 ENCOUNTER — Emergency Department: Admit: 2016-08-14 | Payer: MEDICARE | Primary: Family Medicine

## 2016-08-13 ENCOUNTER — Inpatient Hospital Stay
Admit: 2016-08-13 | Discharge: 2016-08-14 | Disposition: A | Discharge status: Home / Self Care | Payer: MEDICARE | Attending: Emergency Medicine

## 2016-08-13 ENCOUNTER — Emergency Department: Admit: 2016-08-13 | Payer: MEDICARE | Primary: Family Medicine

## 2016-08-13 DIAGNOSIS — M533 Sacrococcygeal disorders, not elsewhere classified: Secondary | ICD-10-CM

## 2016-08-13 MED ORDER — FENTANYL CITRATE (PF) 100 MCG/2ML IJ SOLN
100 MCG/2ML | Freq: Once | INTRAMUSCULAR | Status: AC
Start: 2016-08-13 — End: 2016-08-13
  Administered 2016-08-14: 25 ug via INTRAVENOUS

## 2016-08-13 MED ORDER — SODIUM CHLORIDE 0.9 % IV SOLN
0.9 % | INTRAVENOUS | Status: DC
Start: 2016-08-13 — End: 2016-08-14
  Administered 2016-08-14: via INTRAVENOUS

## 2016-08-13 MED ORDER — ONDANSETRON HCL 4 MG/2ML IJ SOLN
4 MG/2ML | Freq: Once | INTRAMUSCULAR | Status: AC
Start: 2016-08-13 — End: 2016-08-13
  Administered 2016-08-14: 4 mg via INTRAVENOUS

## 2016-08-13 NOTE — Telephone Encounter (Signed)
Patient called to state that he had procedure done on 08/11/16 and that he is having increased pain.  Then his wife called back stating that he is having a temp of 102.  Informed Dr. Sandria Manly and he called patient and instructed to go to ER for eval and MRI.

## 2016-08-13 NOTE — ED Notes (Signed)
2nd blood culture and repeat chemistries drawn from right ac     Maryellen Pilemily B Amari Burnsworth, RN  08/13/16 1942

## 2016-08-13 NOTE — ED Triage Notes (Signed)
Pt had lumbar epidural Monday with Dr Sandria ManlyLove, now has fever and pain at the epidural site

## 2016-08-13 NOTE — ED Provider Notes (Signed)
[facility] EMERGENCY DEPT  eMERGENCY dEPARTMENT eNCOUnter      Pt Name: Richard Sims  MRN: [id number]  Birthdate 05-30-1951  Date of evaluation: 08/13/2016  Provider: Bunnie PhilipsALLAN Brihanna Devenport, MD    CHIEF COMPLAINT       Chief Complaint   Patient presents with   ??? Fever   ??? Back Pain         HISTORY OF PRESENT ILLNESS   (Location/Symptom, Timing/Onset, Context/Setting, Quality, Duration, Modifying Factors, Severity)  Note limiting factors.   Richard Sims is a 65 y.o. male who presents to the emergency department Complaining of fever and low back pain that started 2-3 days ago after an injection.  Patient was sent to the emergency department by his pain management doctor.  The concern was for initially epidural abscess as the patient developed back pain with fever and point tenderness after injections.  Patient states that his pain is more centered on the right side and is worse with bearing weight or movement states he's had these injections multiple times in the past and has never had pain like this before.  He denies any nausea vomiting diarrhea cough or congestion or sore throat or sick contact that he knows of.     HPI    Nursing Notes were reviewed.    REVIEW OF SYSTEMS    (2-9 systems for level 4, 10 or more for level 5)     Review of Systems   Constitutional: Positive for fever. Negative for activity change, appetite change, chills, diaphoresis and fatigue.   HENT: Negative for congestion and sore throat.    Eyes: Negative for photophobia and visual disturbance.   Respiratory: Negative for cough, shortness of breath and wheezing.    Cardiovascular: Negative for chest pain, palpitations and leg swelling.   Gastrointestinal: Negative for abdominal distention, abdominal pain, constipation, diarrhea and nausea.   Endocrine: Negative for cold intolerance and heat intolerance.   Genitourinary: Negative for difficulty urinating and dysuria.   Musculoskeletal: Positive for back pain.   Skin: Negative for rash.   Neurological: Negative  for dizziness, seizures, syncope and weakness.   Hematological: Negative for adenopathy.   Psychiatric/Behavioral: Negative for agitation, confusion and suicidal ideas.            PAST MEDICAL HISTORY     Past Medical History:   Diagnosis Date   ??? CAD (coronary artery disease)    ??? Cervical disc disease    ??? GERD (gastroesophageal reflux disease)    ??? Hyperlipidemia     [facility] manage his lipid/liver panel   ??? Hypertension    ??? Low back pain radiating to left leg 09/11/2014   ??? Lumbar degenerative disc disease 08/11/2014   ??? Lumbar radiculopathy 08/11/2014   ??? Myalgia 05/26/2016   ??? Nephrolithiasis    ??? S/P CABG x 1     MICS with LIMA to LAD via left mini thoracotomy   ??? Statin intolerance 05/26/2016         SURGICAL HISTORY       Past Surgical History:   Procedure Laterality Date   ??? [facility]    [facility]  [facility] 60%   ??? [facility]    with [facility], stent to LAD   ??? CORONARY ARTERY BYPASS GRAFT  06/02/2011     HYBRID MICS CABG, LT THORACOTOMY APPROACH, LIMA-LAD, PCI DES-RCA   ??? JOINT REPLACEMENT      LEFT KNEE   ???  KNEE ARTHROSCOPY      BILATERAL   ??? SPINE SURGERY      ACF X 2         CURRENT MEDICATIONS       Previous Medications    ALIROCUMAB (PRALUENT SC)    Inject into the skin    ASPIRIN 81 MG EC TABLET    Take 81 mg by mouth daily.      CELECOXIB (CELEBREX) 200 MG CAPSULE    Take 1 capsule by mouth 2 times daily    DUTASTERIDE (AVODART) 0.5 MG CAPSULE    Take 0.5 mg by mouth daily.    ESOMEPRAZOLE MAGNESIUM (NEXIUM) 40 MG PACK    Take 40 mg by mouth as needed    METOPROLOL TARTRATE (LOPRESSOR) 25 MG TABLET    Take 12.5 mg by mouth 2 times daily    NITROGLYCERIN (NITROSTAT) 0.4 MG SL TABLET    Place 1 tablet under the tongue every 5 minutes as needed for Chest pain up to 3 doses, in no relief report to the ER.    RAPAFLO 8 MG CAPS    8 mg every evening     TIZANIDINE (ZANAFLEX) 4 MG TABLET    Take 1 tablet by mouth 2 times daily as needed (one  month supply)       ALLERGIES     Crestor [rosuvastatin] and Simvastatin    FAMILY HISTORY       Family History   Problem Relation Age of Onset   ??? Heart Disease Father    ??? High Blood Pressure Father    ??? High Cholesterol Father    ??? High Blood Pressure Mother    ??? Depression Paternal Grandmother           SOCIAL HISTORY       Social History     Social History   ??? Marital status: Married     Spouse name: N/A   ??? Number of children: N/A   ??? Years of education: N/A     Social History Main Topics   ??? Smoking status: Never Smoker   ??? Smokeless tobacco: Never Used   ??? Alcohol use Yes      Comment: rarely   ??? Drug use: No   ??? Sexual activity: Yes     Partners: Female     Other Topics Concern   ??? Not on file     Social History Narrative   ??? No narrative on file       SCREENINGS             PHYSICAL EXAM    (up to 7 for level 4, 8 or more for level 5)     ED Triage Vitals [08/13/16 1716]   BP Temp Temp Source Pulse Resp SpO2 Height Weight   117/66 101.2 ??F (38.4 ??C) Oral 113 20 91 % 5\' 7"  (1.702 m) 188 lb (85.3 kg)       Physical Exam   Constitutional: He is oriented to person, place, and time. He appears well-developed and well-nourished.   HENT:   Head: Normocephalic.   Right Ear: External ear normal.   Eyes: Pupils are equal, round, and reactive to light.   Neck: Normal range of motion. Neck supple.   Cardiovascular: Normal rate and normal heart sounds.    Pulmonary/Chest: Effort normal and breath sounds normal. He has no wheezes.   Abdominal: Soft. Bowel sounds are normal. He exhibits no distension. There is no tenderness.  There is no rebound and no guarding.   Musculoskeletal: Normal range of motion. He exhibits no edema or tenderness.        Back:    Neurological: He is alert and oriented to person, place, and time.   Skin: Skin is warm and dry. No rash noted. No erythema. No pallor.   Psychiatric: His behavior is normal.       DIAGNOSTIC RESULTS     EKG: All EKG's are interpreted by the Emergency Department  Physician who either signs or Co-signs this chart in the absence of a cardiologist.        RADIOLOGY:   Non-plain film images such as CT, Ultrasound and MRI are read by the radiologist. Plain radiographic images are visualized and preliminarily interpreted by the emergency physician with the below findings:          CT ABDOMEN PELVIS W IV CONTRAST Additional Contrast? None   Final Result   1. Findings indicate the injection site at the inferior right SI   joint. There is no abscess or hematoma.    Signed by Dr Waylan Bogaussell Eric Shields on 08/13/2016 9:43 PM      MRI LUMBAR SPINE WO CONTRAST   Final Result   1. Multilevel degenerative disc and facet changes with mild to   moderate spinal canal stenosis at L3-4 and L4-5.   2. Foraminal stenosis is greatest at L4-5 and L5-S1.   3. No epidural abscess or hematoma is seen.   Signed by Dr Waylan Bogaussell Eric Shields on 08/13/2016 6:52 PM              LABS:  Labs Reviewed   CBC WITH AUTO DIFFERENTIAL - Abnormal; Notable for the following:        Result Value    RBC 4.14 (*)     Hemoglobin 12.7 (*)     Hematocrit 38.4 (*)     Platelets 107 (*)     Neutrophils % 81.0 (*)     Lymphocytes % 8.0 (*)     Neutrophils # 8.9 (*)     Lymphocytes # 0.9 (*)     Myelocytes Relative 1 (*)     Anisocytosis 1+ (*)     Microcytes 1+ (*)     All other components within normal limits   COMPREHENSIVE METABOLIC PANEL - Abnormal; Notable for the following:     Glucose 121 (*)     Calcium 7.9 (*)     Total Protein 5.5 (*)     Alb 3.3 (*)     All other components within normal limits   C-REACTIVE PROTEIN - Abnormal; Notable for the following:     CRP 12.90 (*)     All other components within normal limits   URINALYSIS - Abnormal; Notable for the following:     Blood, Urine MODERATE (*)     Protein, UA 30 (*)     All other components within normal limits   MICROSCOPIC URINALYSIS - Abnormal; Notable for the following:     RBC, UA 25 (*)     All other components within normal limits   RAPID INFLUENZA A/B  ANTIGENS   CULTURE BLOOD #1   CULTURE BLOOD #2   LACTIC ACID, PLASMA   SEDIMENTATION RATE       All other labs were within normal range or not returned as of this dictation.    EMERGENCY DEPARTMENT COURSE and DIFFERENTIAL DIAGNOSIS/MDM:   Vitals:    Vitals:  08/13/16 1911 08/13/16 2107 08/13/16 2108 08/13/16 2210   BP: 117/64 111/65  125/69   Pulse: 97 83  69   Resp: 22 16  16    Temp:   100.5 ??F (38.1 ??C)    TempSrc:   Oral    SpO2: 92% 93%  97%   Weight:       Height:           MDM  Number of Diagnoses or Management Options  Fever in adult: new and requires workup  Sacro ilial pain: new and requires workup     Amount and/or Complexity of Data Reviewed  Clinical lab tests: ordered and reviewed  Tests in the radiology section of CPT??: ordered and reviewed  Decide to obtain previous medical records or to obtain history from someone other than the patient: yes  Review and summarize past medical records: yes  Discuss the patient with other providers: yes  Independent visualization of images, tracings, or specimens: yes    Risk of Complications, Morbidity, and/or Mortality  Presenting problems: high  Diagnostic procedures: high  Management options: high    Patient Progress  Patient progress: improved      Reassessment  10 PM symptoms have improved I discussed the CT results with the patient the plan would be to have the patient follow-up with Dr. love for his point tenderness to his right SI joint patient is to return to the emergency department if he develops any new symptoms including but not limited to increased pain vomiting or bowel or bladder incontinence.    MRI was ordered in the emergency department to ensure no surgical emergency existed in the form of an epidural abscess which would have required surgical intervention tonight.    CONSULTS:  None    PROCEDURES:  Unless otherwise noted below, none     Procedures    FINAL IMPRESSION      1. Fever in adult    2. Sacro ilial pain          DISPOSITION/PLAN    DISPOSITION Decision To Discharge 08/13/2016 10:14:58 PM      PATIENT REFERRED TO:  Beatrix Fetters, MD  [address]  [place] [address]  [phone]      As needed      DISCHARGE MEDICATIONS:  New Prescriptions    HYDROCODONE-ACETAMINOPHEN (NORCO) 7.5-325 MG PER TABLET    Take 1 tablet by mouth every 6 hours as needed for Pain .    MINOCYCLINE (MINOCIN;DYNACIN) 100 MG CAPSULE    Take 1 capsule by mouth 2 times daily    ONDANSETRON (ZOFRAN ODT) 4 MG DISINTEGRATING TABLET    Take 1 tablet by mouth every 8 hours as needed for Nausea or Vomiting          (Please note that portions of this note were completed with a voice recognition program.  Efforts were made to edit the dictations but occasionally words are mis-transcribed.)    Bunnie Philips, MD (electronically signed)  Attending Emergency Physician          Bunnie Philips, MD  08/13/16 2227

## 2016-08-13 NOTE — ED Notes (Signed)
Lab called and they need a recollect on chemistry due to being hemolyzed.      Richard RathkeMatthew Samiha Denapoli, RN  08/13/16 [phone]

## 2016-08-13 NOTE — ED Notes (Signed)
Patient to MRI     Tessie FassCynthia L Daziyah Cogan, RN  08/13/16 [phone]

## 2016-08-14 LAB — URINALYSIS
Bilirubin Urine: NEGATIVE
Glucose, Ur: NEGATIVE mg/dL
Ketones, Urine: NEGATIVE mg/dL
Leukocyte Esterase, Urine: NEGATIVE
Nitrite, Urine: NEGATIVE
Protein, UA: 30 mg/dL — AB
Specific Gravity, UA: 1.021 (ref 1.005–1.030)
Urobilinogen, Urine: 1 E.U./dL (ref <2.0)
pH, UA: 7 (ref 5.0–8.0)

## 2016-08-14 LAB — COMPREHENSIVE METABOLIC PANEL
ALT: 23 U/L (ref 5–41)
AST: 21 U/L (ref 5–40)
Albumin: 3.3 g/dL — ABNORMAL LOW (ref 3.5–5.2)
Alkaline Phosphatase: 54 U/L (ref 40–130)
Anion Gap: 8 mmol/L (ref 7–19)
BUN: 21 mg/dL (ref 8–23)
CO2: 24 mmol/L (ref 22–29)
Calcium: 7.9 mg/dL — ABNORMAL LOW (ref 8.8–10.2)
Chloride: 104 mmol/L (ref 98–111)
Creatinine: 1 mg/dL (ref 0.5–1.2)
GFR Non-African American: >60 (ref >60)
Glucose: 121 mg/dL — ABNORMAL HIGH (ref 74–109)
Potassium: 3.5 mmol/L (ref 3.5–5.0)
Sodium: 136 mmol/L (ref 136–145)
Total Bilirubin: 0.6 mg/dL (ref 0.2–1.2)
Total Protein: 5.5 g/dL — ABNORMAL LOW (ref 6.6–8.7)

## 2016-08-14 LAB — CBC WITH AUTO DIFFERENTIAL
Bands Relative: 1 % (ref 0–5)
Basophils %: 0 % (ref 0.0–1.0)
Basophils Absolute: 0 10*3/uL (ref 0.00–0.20)
Basophils Manual: 0 %
Eosinophils %: 1 % (ref 0.0–5.0)
Eosinophils Absolute: 0.11 10*3/uL (ref 0.00–0.60)
Hematocrit: 38.4 % — ABNORMAL LOW (ref 42.0–52.0)
Hemoglobin: 12.7 g/dL — ABNORMAL LOW (ref 14.0–18.0)
Lymphocytes %: 8 % — ABNORMAL LOW (ref 20.0–40.0)
Lymphocytes Absolute: 0.9 10*3/uL — ABNORMAL LOW (ref 1.1–4.5)
MCH: 30.7 pg (ref 27.0–31.0)
MCHC: 33.1 g/dL (ref 33.0–37.0)
MCV: 92.8 fL (ref 80.0–94.0)
MPV: 10.2 fL (ref 9.4–12.4)
Monocytes %: 8 % (ref 0.0–10.0)
Monocytes Absolute: 0.9 10*3/uL (ref 0.00–0.90)
Myelocyte Percent: 1 % — AB
Neutrophils %: 81 % — ABNORMAL HIGH (ref 50.0–65.0)
Neutrophils Absolute: 8.9 10*3/uL — ABNORMAL HIGH (ref 1.5–7.5)
Neutrophils Manual: 81 %
PLATELET SLIDE REVIEW: DECREASED
Platelets: 107 10*3/uL — ABNORMAL LOW (ref 130–400)
RBC: 4.14 M/uL — ABNORMAL LOW (ref 4.70–6.10)
RDW: 12.9 % (ref 11.5–14.5)
WBC: 10.7 10*3/uL (ref 4.8–10.8)

## 2016-08-14 LAB — C-REACTIVE PROTEIN: CRP: 12.9 mg/dL — ABNORMAL HIGH (ref 0.00–0.50)

## 2016-08-14 LAB — RAPID INFLUENZA A/B ANTIGENS
Rapid Influenza A Ag: NEGATIVE
Rapid Influenza B Ag: NEGATIVE

## 2016-08-14 LAB — LACTIC ACID: Lactic Acid: 1.3 mmol/L (ref 0.5–1.9)

## 2016-08-14 LAB — MICROSCOPIC URINALYSIS
Bacteria, UA: NEGATIVE /HPF
Epithelial Cells, UA: 1 /HPF (ref 0–5)
Hyaline Casts, UA: 0 /HPF (ref 0–8)
RBC, UA: 25 /HPF — ABNORMAL HIGH (ref 0–4)
WBC, UA: 1 /HPF (ref 0–5)

## 2016-08-14 LAB — SEDIMENTATION RATE: Sed Rate: 4 mm/Hr (ref 0–15)

## 2016-08-14 MED ORDER — HYDROCODONE-ACETAMINOPHEN 7.5-325 MG PO TABS
ORAL_TABLET | Freq: Four times a day (QID) | ORAL | 0 refills | Status: DC | PRN
Start: 2016-08-14 — End: 2016-10-23

## 2016-08-14 MED ORDER — IOPAMIDOL 76 % IV SOLN
76 % | Freq: Once | INTRAVENOUS | Status: AC | PRN
Start: 2016-08-14 — End: 2016-08-13
  Administered 2016-08-14: 02:00:00 90 mL via INTRAVENOUS

## 2016-08-14 MED ORDER — ONDANSETRON 4 MG PO TBDP
4 MG | ORAL_TABLET | Freq: Three times a day (TID) | ORAL | 0 refills | Status: DC | PRN
Start: 2016-08-14 — End: 2016-12-22

## 2016-08-14 MED ORDER — FENTANYL CITRATE (PF) 100 MCG/2ML IJ SOLN
100 MCG/2ML | Freq: Once | INTRAMUSCULAR | Status: AC
Start: 2016-08-14 — End: 2016-08-13
  Administered 2016-08-14: 02:00:00 50 ug via INTRAVENOUS

## 2016-08-14 MED ORDER — MINOCYCLINE HCL 100 MG PO CAPS
100 MG | ORAL_CAPSULE | Freq: Two times a day (BID) | ORAL | 0 refills | Status: DC
Start: 2016-08-14 — End: 2016-12-22

## 2016-08-14 MED FILL — FENTANYL CITRATE (PF) 100 MCG/2ML IJ SOLN: 100 MCG/2ML | INTRAMUSCULAR | Qty: 2

## 2016-08-14 MED FILL — ONDANSETRON HCL 4 MG/2ML IJ SOLN: 4 MG/2ML | INTRAMUSCULAR | Qty: 2

## 2016-08-15 MED ORDER — DICLOFENAC SODIUM 2 % TD SOLN
2 % | Freq: Two times a day (BID) | TRANSDERMAL | 5 refills | Status: DC
Start: 2016-08-15 — End: 2016-12-22

## 2016-08-19 LAB — CULTURE, BLOOD 2: Culture, Blood 2: NO GROWTH

## 2016-08-19 LAB — CULTURE BLOOD #1: Blood Culture, Routine: NO GROWTH

## 2016-08-19 MED ORDER — HYDROCODONE-ACETAMINOPHEN 7.5-325 MG PO TABS
ORAL_TABLET | Freq: Two times a day (BID) | ORAL | 0 refills | Status: DC | PRN
Start: 2016-08-19 — End: 2016-10-23

## 2016-09-08 LAB — CBC WITH AUTO DIFFERENTIAL
Basophils %: 0.8 % (ref 0.0–1.0)
Basophils Absolute: 0.1 10*3/uL (ref 0.00–0.20)
Eosinophils %: 0.9 % (ref 0.0–5.0)
Eosinophils Absolute: 0.1 10*3/uL (ref 0.00–0.60)
Hematocrit: 38.4 % — ABNORMAL LOW (ref 42.0–52.0)
Hemoglobin: 12.8 g/dL — ABNORMAL LOW (ref 14.0–18.0)
Lymphocytes %: 12.8 % — ABNORMAL LOW (ref 20.0–40.0)
Lymphocytes Absolute: 1 10*3/uL — ABNORMAL LOW (ref 1.1–4.5)
MCH: 30.2 pg (ref 27.0–31.0)
MCHC: 33.3 g/dL (ref 33.0–37.0)
MCV: 90.6 fL (ref 80.0–94.0)
MPV: 10 fL (ref 9.4–12.4)
Monocytes %: 6.3 % (ref 0.0–10.0)
Monocytes Absolute: 0.5 10*3/uL (ref 0.00–0.90)
Neutrophils %: 78.7 % — ABNORMAL HIGH (ref 50.0–65.0)
Neutrophils Absolute: 6.1 10*3/uL (ref 1.5–7.5)
Platelets: 269 10*3/uL (ref 130–400)
RBC: 4.24 M/uL — ABNORMAL LOW (ref 4.70–6.10)
RDW: 12.3 % (ref 11.5–14.5)
WBC: 7.7 10*3/uL (ref 4.8–10.8)

## 2016-09-15 LAB — CBC WITH AUTO DIFFERENTIAL
Basophils %: 1 % (ref 0.0–1.0)
Basophils Absolute: 0.1 10*3/uL (ref 0.00–0.20)
Eosinophils %: 1 % (ref 0.0–5.0)
Eosinophils Absolute: 0.1 10*3/uL (ref 0.00–0.60)
Hematocrit: 38.2 % — ABNORMAL LOW (ref 42.0–52.0)
Hemoglobin: 12.4 g/dL — ABNORMAL LOW (ref 14.0–18.0)
Lymphocytes %: 21.1 % (ref 20.0–40.0)
Lymphocytes Absolute: 1.6 10*3/uL (ref 1.1–4.5)
MCH: 29.7 pg (ref 27.0–31.0)
MCHC: 32.5 g/dL — ABNORMAL LOW (ref 33.0–37.0)
MCV: 91.4 fL (ref 80.0–94.0)
MPV: 10.1 fL (ref 9.4–12.4)
Monocytes %: 7.1 % (ref 0.0–10.0)
Monocytes Absolute: 0.5 10*3/uL (ref 0.00–0.90)
Neutrophils %: 69.5 % — ABNORMAL HIGH (ref 50.0–65.0)
Neutrophils Absolute: 5.1 10*3/uL (ref 1.5–7.5)
Platelets: 162 10*3/uL (ref 130–400)
RBC: 4.18 M/uL — ABNORMAL LOW (ref 4.70–6.10)
RDW: 12.4 % (ref 11.5–14.5)
WBC: 7.3 10*3/uL (ref 4.8–10.8)

## 2016-09-22 LAB — CBC WITH AUTO DIFFERENTIAL
Basophils %: 1.4 % — ABNORMAL HIGH (ref 0.0–1.0)
Basophils Absolute: 0.1 10*3/uL (ref 0.00–0.20)
Eosinophils %: 1.7 % (ref 0.0–5.0)
Eosinophils Absolute: 0.1 10*3/uL (ref 0.00–0.60)
Hematocrit: 37.9 % — ABNORMAL LOW (ref 42.0–52.0)
Hemoglobin: 12.3 g/dL — ABNORMAL LOW (ref 14.0–18.0)
Lymphocytes %: 19.6 % — ABNORMAL LOW (ref 20.0–40.0)
Lymphocytes Absolute: 1.4 10*3/uL (ref 1.1–4.5)
MCH: 29.3 pg (ref 27.0–31.0)
MCHC: 32.5 g/dL — ABNORMAL LOW (ref 33.0–37.0)
MCV: 90.2 fL (ref 80.0–94.0)
MPV: 10.9 fL (ref 9.4–12.4)
Monocytes %: 7.9 % (ref 0.0–10.0)
Monocytes Absolute: 0.6 10*3/uL (ref 0.00–0.90)
Neutrophils %: 69.3 % — ABNORMAL HIGH (ref 50.0–65.0)
Neutrophils Absolute: 5 10*3/uL (ref 1.5–7.5)
Platelets: 116 10*3/uL — ABNORMAL LOW (ref 130–400)
RBC: 4.2 M/uL — ABNORMAL LOW (ref 4.70–6.10)
RDW: 12.3 % (ref 11.5–14.5)
WBC: 7.2 10*3/uL (ref 4.8–10.8)

## 2016-09-22 LAB — SEDIMENTATION RATE: Sed Rate: 29 mm/Hr — ABNORMAL HIGH (ref 0–15)

## 2016-09-22 LAB — C-REACTIVE PROTEIN: CRP: 0.66 mg/dL — ABNORMAL HIGH (ref 0.00–0.50)

## 2016-09-29 LAB — CBC WITH AUTO DIFFERENTIAL
Basophils %: 1 % (ref 0.0–1.0)
Basophils Absolute: 0.1 10*3/uL (ref 0.00–0.20)
Eosinophils %: 2.1 % (ref 0.0–5.0)
Eosinophils Absolute: 0.1 10*3/uL (ref 0.00–0.60)
Hematocrit: 37 % — ABNORMAL LOW (ref 42.0–52.0)
Hemoglobin: 12.2 g/dL — ABNORMAL LOW (ref 14.0–18.0)
Lymphocytes %: 17.6 % — ABNORMAL LOW (ref 20.0–40.0)
Lymphocytes Absolute: 1 10*3/uL — ABNORMAL LOW (ref 1.1–4.5)
MCH: 29.4 pg (ref 27.0–31.0)
MCHC: 33 g/dL (ref 33.0–37.0)
MCV: 89.2 fL (ref 80.0–94.0)
MPV: 11.3 fL (ref 9.4–12.4)
Monocytes %: 7.4 % (ref 0.0–10.0)
Monocytes Absolute: 0.4 10*3/uL (ref 0.00–0.90)
Neutrophils %: 71.6 % — ABNORMAL HIGH (ref 50.0–65.0)
Neutrophils Absolute: 4.2 10*3/uL (ref 1.5–7.5)
Platelets: 200 10*3/uL (ref 130–400)
RBC: 4.15 M/uL — ABNORMAL LOW (ref 4.70–6.10)
RDW: 13.5 % (ref 11.5–14.5)
WBC: 5.8 10*3/uL (ref 4.8–10.8)

## 2016-09-29 LAB — SEDIMENTATION RATE: Sed Rate: 17 mm/Hr — ABNORMAL HIGH (ref 0–15)

## 2016-09-29 LAB — C-REACTIVE PROTEIN: CRP: 0.39 mg/dL (ref 0.00–0.50)

## 2016-10-07 LAB — CBC WITH AUTO DIFFERENTIAL
Basophils %: 1.8 % — ABNORMAL HIGH (ref 0.0–1.0)
Basophils Absolute: 0.1 10*3/uL (ref 0.00–0.20)
Eosinophils %: 2.8 % (ref 0.0–5.0)
Eosinophils Absolute: 0.1 10*3/uL (ref 0.00–0.60)
Hematocrit: 37.9 % — ABNORMAL LOW (ref 42.0–52.0)
Hemoglobin: 12.4 g/dL — ABNORMAL LOW (ref 14.0–18.0)
Lymphocytes %: 31.4 % (ref 20.0–40.0)
Lymphocytes Absolute: 1.6 10*3/uL (ref 1.1–4.5)
MCH: 29.7 pg (ref 27.0–31.0)
MCHC: 32.7 g/dL — ABNORMAL LOW (ref 33.0–37.0)
MCV: 90.9 fL (ref 80.0–94.0)
MPV: 10.6 fL (ref 9.4–12.4)
Monocytes %: 8.4 % (ref 0.0–10.0)
Monocytes Absolute: 0.4 10*3/uL (ref 0.00–0.90)
Neutrophils %: 55.4 % (ref 50.0–65.0)
Neutrophils Absolute: 2.8 10*3/uL (ref 1.5–7.5)
Platelets: 204 10*3/uL (ref 130–400)
RBC: 4.17 M/uL — ABNORMAL LOW (ref 4.70–6.10)
RDW: 14.3 % (ref 11.5–14.5)
WBC: 5 10*3/uL (ref 4.8–10.8)

## 2016-10-07 LAB — SEDIMENTATION RATE: Sed Rate: 13 mm/Hr (ref 0–15)

## 2016-10-07 LAB — C-REACTIVE PROTEIN: CRP: 0.22 mg/dL (ref 0.00–0.50)

## 2016-10-14 LAB — CBC WITH AUTO DIFFERENTIAL
Atypical Lymphocytes Relative: 1 % (ref 0–8)
Basophils %: 0 % (ref 0.0–1.0)
Basophils Absolute: 0 10*3/uL (ref 0.00–0.20)
Basophils Manual: 0 %
Eosinophils %: 1 % (ref 0.0–5.0)
Eosinophils Absolute: 0.07 10*3/uL (ref 0.00–0.60)
Hematocrit: 37.4 % — ABNORMAL LOW (ref 42.0–52.0)
Hemoglobin: 11.8 g/dL — ABNORMAL LOW (ref 14.0–18.0)
Lymphocytes %: 41 % — ABNORMAL HIGH (ref 20.0–40.0)
Lymphocytes Absolute: 2.9 10*3/uL (ref 1.1–4.5)
MCH: 29.6 pg (ref 27.0–31.0)
MCHC: 31.6 g/dL — ABNORMAL LOW (ref 33.0–37.0)
MCV: 93.7 fL (ref 80.0–94.0)
MPV: 12.5 fL — ABNORMAL HIGH (ref 9.4–12.4)
Monocytes %: 0 % (ref 0.0–10.0)
Monocytes Absolute: 0 10*3/uL (ref 0.00–0.90)
Neutrophils %: 57 % (ref 50.0–65.0)
Neutrophils Absolute: 3.9 10*3/uL (ref 1.5–7.5)
Neutrophils Manual: 57 %
PLATELET SLIDE REVIEW: DECREASED
Platelets: 13 10*3/uL — CL (ref 130–400)
RBC: 3.99 M/uL — ABNORMAL LOW (ref 4.70–6.10)
RDW: 15 % — ABNORMAL HIGH (ref 11.5–14.5)
WBC: 6.9 10*3/uL (ref 4.8–10.8)

## 2016-10-23 ENCOUNTER — Inpatient Hospital Stay: Admit: 2016-10-23 | Payer: MEDICARE | Attending: Family | Primary: Family Medicine

## 2016-10-23 DIAGNOSIS — M5116 Intervertebral disc disorders with radiculopathy, lumbar region: Secondary | ICD-10-CM

## 2016-10-23 MED ORDER — HYDROCODONE-ACETAMINOPHEN 7.5-325 MG PO TABS
7.5-325 | ORAL_TABLET | Freq: Two times a day (BID) | ORAL | 0 refills | Status: DC | PRN
Start: 2016-10-23 — End: 2016-11-14

## 2016-10-23 NOTE — Progress Notes (Signed)
Nursing Admission Record    Current Issues / Falls / ER Visits:  Yes to ER at [facility] in [place], [place].    Percentage of Pain Relief after Last Procedure:  0 %    How long lasted:  0 days    Radiology exams received during the last 12 months: Yes       When Feb 2018                                              Where [facility]       Imaging on chart: Yes         Imaging records requested: No  MRI exams received in the past 2 years:  Yes Lumbar MRI in Dec 2017 at [facility] and MRI of Pelvis in Feb 2018 at [facility]  Physical therapy during the last 6 months: No       When: na                                             Where  na  Labs during the last 12 months: Yes    Education Provided:  [x]  Review of [place]  []  Agreement Review  []  Compliance Issues Discussed    []  Cognitive Behavior Needs [x]  Exercise []  Review of Test []  Financial Issues  [x]  Tobacco/Alcohol Use [x]  Teaching []  New Patient []  Picture Obtained    Physician Plan:  []  Outgoing Referral  []  Pharmacy Consult  []  Test Ordered   []  Obtained Test Results / Consult Notes  [x]  UDS due at next visit, verified per EPIC      []  Suspected Physical Abuse or Suicide Risk assessed - IF YES COMPLETE QUESTIONS BELOW    If any of the following questions are answered yes - contact attending physician for referral:    Has been considering harming self to escape stress, pain problems?  []  YES  []  NO  Has a suicide plan? []  YES  []  NO  Has attempted suicide in the past?   []  YES  []  NO  Has a close friend or family member who committed suicide?  []  YES  []  NO    Patient Referred To :     Additional Notes:    Assessment Completed by:  Electronically signed by Erroll Luna, RN on 10/23/2016 at 3:27 PM

## 2016-10-23 NOTE — Progress Notes (Signed)
[facility]  Patient Pain Assessment  Progress Note       Chief Complaint   Patient presents with   ??? Lower Back Pain     does not radiate today     Pain Assessment  Pain Assessment: 0-10  Pain Level: 4  Pain Type: Chronic pain  Pain Location: Back  Pain Orientation: Lower  Pain Radiating Towards: does not radiate today   Pain Descriptors: Aching (deep aching)  Pain Frequency: Continuous  Pain Onset: On-going  Clinical Progression: Not changed  Effect of Pain on Daily Activities: limits activities       [x]   Issues of Concern:    Reports [facility] [place] since last visit     [x]   Reports current medication is helping, but continues to have on-going pain    [x]   Reports current pain medication increases ability to do activities of daily living     []   No current narcotic pain medications      [x]   Discussed possible medication side effects, risk of tolerance and/or dependence, alternative treatments    [x]   Encouraged to set goals of decreasing daily narcotic intake    [x]   Discussed effects of long term narcotic use    [x]   Reviewed pain management contract     [x]   Injection options discussed      [] Yes [x] No  Current medication side effects     [] Yes [x] No   Acute bladder or bowel changes    Previous Procedure / Percentage of pain control / Imaging / PT History:  Percentage of Pain Relief after Last Procedure:  0 %    How long lasted:  0 days  ??  Radiology exams received during the last 12 months: Yes  When: Feb 2018                                             [facility]  Imaging on chart: Yes    MRI exams received in the past 2 years: [facility] and [facility]  Physical therapy during the last 6 months: No     BMI: Body mass index is 27.57 kg/m??.    [x]   Nutrient rich, low fat, low carbohydrate diet discussed   [x]   Positive effect of weight management on pain control discussed    Activity:   [x]   Exercise discussed as beneficial to pain reduction, encouraged stretching exercises and to set daily goals    Tobacco use:    [x]  Cessation discussed, as applicable    Social History     Social History   ??? Marital status: Married     Spouse name: N/A   ??? Number of children: N/A   ??? Years of education: N/A     Social History Main Topics   ??? Smoking status: Never Smoker   ??? Smokeless tobacco: Never Used   ??? Alcohol use Yes      Comment: rarely   ??? Drug use: No   ??? Sexual activity: Yes     Partners: Female     Other Topics Concern   ??? None     Social History Narrative   ??? None  Past Medical History:       Diagnosis Date   ??? CAD (coronary artery disease)    ??? Cervical disc disease    ??? GERD (gastroesophageal reflux disease)    ??? Hyperlipidemia     [facility] manage his lipid/liver panel   ??? Hypertension    ??? Low back pain radiating to left leg 09/11/2014   ??? Lumbar degenerative disc disease 08/11/2014   ??? Lumbar radiculopathy 08/11/2014   ??? Myalgia 05/26/2016   ??? Nephrolithiasis    ??? S/P CABG x 1     MICS with LIMA to LAD via left mini thoracotomy   ??? Statin intolerance 05/26/2016     Surgical History:  Past Surgical History:   Procedure Laterality Date   ??? CARDIAC CATHETERIZATION  05/29/11   [facility] -     EF  over 60%   ??? CARDIAC CATHETERIZATION  12/12/2011  [facility]    with PTCA, stent to LAD   ??? CORONARY ARTERY BYPASS GRAFT  06/02/2011     HYBRID MICS CABG, LT THORACOTOMY APPROACH, LIMA-LAD, PCI DES-RCA   ??? JOINT REPLACEMENT      LEFT KNEE   ??? KNEE ARTHROSCOPY      BILATERAL   ??? SPINE SURGERY      ACF X 2     Family History:  family history includes Depression in his paternal grandmother; Heart Disease in his father; High Blood Pressure in his father and mother; High Cholesterol in his father.     Allergies:  Crestor [rosuvastatin] and Simvastatin     Medications:  Current Outpatient Prescriptions   Medication Sig Dispense Refill   ??? HYDROcodone-acetaminophen  (NORCO) 7.5-325 MG per tablet Take 1 tablet by mouth 2 times daily as needed for Pain  May fill today. 60 tablet 0   ??? diclofenac (PENNSAID) 2 % SOLN Place 2 Squirts onto the skin 2 times daily 2 Bottle 5   ??? ondansetron (ZOFRAN ODT) 4 MG disintegrating tablet Take 1 tablet by mouth every 8 hours as needed for Nausea or Vomiting 15 tablet 0   ??? minocycline (MINOCIN;DYNACIN) 100 MG capsule Take 1 capsule by mouth 2 times daily 20 capsule 0   ??? Alirocumab (PRALUENT SC) Inject into the skin     ??? celecoxib (CELEBREX) 200 MG capsule Take 1 capsule by mouth 2 times daily 180 capsule 1   ??? metoprolol tartrate (LOPRESSOR) 25 MG tablet Take 12.5 mg by mouth 2 times daily     ??? tiZANidine (ZANAFLEX) 4 MG tablet Take 1 tablet by mouth 2 times daily as needed (one month supply) 45 tablet 2   ??? esomeprazole Magnesium (NEXIUM) 40 MG PACK Take 40 mg by mouth as needed     ??? RAPAFLO 8 MG CAPS 8 mg every evening      ??? nitroGLYCERIN (NITROSTAT) 0.4 MG SL tablet Place 1 tablet under the tongue every 5 minutes as needed for Chest pain up to 3 doses, in no relief report to the ER. 25 tablet 3   ??? dutasteride (AVODART) 0.5 MG capsule Take 0.5 mg by mouth daily.     ??? aspirin 81 MG EC tablet Take 81 mg by mouth daily.         No current facility-administered medications for this encounter.      UDS:     [x]  Reviewed and monitoring, see labs         Vitals:  BP 158/97    Pulse 91    Temp 97.4 ??  F (36.3 ??C) (Temporal)    Resp 18    Ht 5\' 7"  (1.702 m)    Wt 176 lb (79.8 kg)    SpO2 100%    BMI 27.57 kg/m??      [x]  Recheck: 147/86, discussed using home B/P monitor/diary and to contact PCP if elevated, as applicable     Physical Exam:  General appearance: no acute distress   Head: NCAT, EOMI  Skin: Warm, Dry   Musculoskeletal: ambulatory per self, steady gait  Neurologic: alert and oriented X 3, speech clear  Mood and affect: appropriate, no SI or HI    Assessment:    *      Lumbar DDD  *      Lumbar Radiculopathy (per history)  *       Sacroiliitis, Bilateral (per history)    Plan:   [x]   Patient is to call with any questions or concerns which may arise prior to the next office visit    [x]   Continue current medications per our office, see medication tab, KASPER reviewed   []   Add...   []   Discontinue...   []   Imaging order given to patient   []   PT order given to patient   []   Procedure scheduled for next visit, ( - anticoagulant request sent to)   []   ...    Controlled Substance Monitoring: Discussed with patient possible medication side effects, risk of tolerance and/or dependence, and alternative treatments. Discussed the effects of long term narcotic use. Patient encouraged to set daily goals of exercising and decreasing daily narcotic intake.      Electronically signed by Donnetta Hail, APRN

## 2016-10-25 LAB — PAIN MANAGEMENT PROFILE (13 DRUGS), URINE
Amphetamines, urine: NEGATIVE ng/mL
Barbiturates, Urine: NEGATIVE ng/mL
Benzodiazepines, Urine: NEGATIVE ng/mL
Cannabinoids, Urine: NEGATIVE ng/mL
Cocaine Metabolite, Urine: NEGATIVE ng/mL
Creatinine, Urine: 150.6 mg/dL (ref 20.0–300.0)
Ethanol U, Quan: NEGATIVE %
Fentanyl, Ur: NEGATIVE pg/mL
Meperidine, Ur: NEGATIVE ng/mL
Methadone Screen, Urine: NEGATIVE ng/mL
Opiates, Urine: NEGATIVE ng/mL
Oxycodone/Oxymorphone, Ur: NEGATIVE ng/mL
Phencyclidine, Urine: NEGATIVE ng/mL
Propoxyphene, Urine: NEGATIVE ng/mL
pH, Urine: 5.5 (ref 4.5–8.9)

## 2016-11-14 ENCOUNTER — Inpatient Hospital Stay: Admit: 2016-11-14 | Discharge: 2016-11-14 | Payer: MEDICARE | Attending: Pain Medicine | Primary: Family Medicine

## 2016-11-14 DIAGNOSIS — M5136 Other intervertebral disc degeneration, lumbar region: Secondary | ICD-10-CM

## 2016-11-14 MED ORDER — HYDROCODONE-ACETAMINOPHEN 7.5-325 MG PO TABS
7.5-325 | ORAL_TABLET | Freq: Two times a day (BID) | ORAL | 0 refills | Status: DC | PRN
Start: 2016-11-14 — End: 2016-11-14

## 2016-11-14 MED ORDER — HYDROCODONE-ACETAMINOPHEN 7.5-325 MG PO TABS
7.5-325 | ORAL_TABLET | Freq: Two times a day (BID) | ORAL | 0 refills | Status: AC | PRN
Start: 2016-11-14 — End: 2016-12-22

## 2016-11-14 NOTE — Progress Notes (Signed)
[name]  Patient Pain Assessment  Consultation - @ATTENDING  MVHQION@    Primary Care Physician: Keene Breath, MD    Chief complaint:   Chief Complaint   Patient presents with   . Lower Back Pain   .    HISTORY OF PRESENT ILLNESS:  Richard Sims is 66 y.o. male that presents to the office for a 3 month follow up    HPI    Zadie Rhine compliant? yes  Concern for prescription abuse?no    Current Pain Assessment  Pain Assessment  Pain Assessment: 0-10  Pain Level: 6  Pain Location: Back  Pain Orientation: Lower  Pain Radiating Towards: does not radiate  Pain Descriptors: Aching, Throbbing  Pain Frequency: Intermittent  Pain Onset: On-going  Clinical Progression: Not changed  Effect of Pain on Daily Activities: daily chores and activities  Patient's Stated Pain Goal: No pain  Pain Intervention(s): Medication (see eMar), Heat applied, Cold applied, Repositioned, MD notified (comment), RN notified, Shower, Rest  Response to Pain Intervention: None  Multiple Pain Sites: No                    ADVERSE MEDICATION EFFECTS:   Constipation: no  Bowel Regimen: Yes  Diet: common adult  Appetite:  ok  Sedation:  no  Urinary Retention: no    FOCUSED PAIN SCALE:  Highest : 10  Lowest :1  Average: Range-5  When and What  was your last procedure:      Was your procedure effective:  no    ACTIVITY/SOCIAL/EMOTIONAL:  Sleep Pattern: 6 hours per night. nightime awakenings  Energy Level:  Normal  Currently attending Physical Therapy:  No  Home Exercises: never none  Mobility: no current mobility problem   Currently seeing a Psychiatrist or Psychologist:  No  Emotional Issues: normal   Mood: appropriate     ABERRANT BEHAVIORS SINCE LAST VISIT:  Have you ever been treated in another Pain Clinic no  Refills for prescriptions appropriate: yes  Lost rx/pills: no  Taking more medication than prescribed:  no  Are you receiving PAIN medications from  other doctors: no  Last Urine/Serum Drug Screen :  Was Serum/UDS as  anticipated?  yes  Are currently pregnant? no  Recent ER visits: No             Past Medical History      Diagnosis Date   . CAD (coronary artery disease)    . Cervical disc disease    . GERD (gastroesophageal reflux disease)    . Hyperlipidemia     [facility] manage his lipid/liver panel   . Hypertension    . Low back pain radiating to left leg 09/11/2014   . Lumbar degenerative disc disease 08/11/2014   . Lumbar radiculopathy 08/11/2014   . Myalgia 05/26/2016   . Nephrolithiasis    . S/P CABG x 1     MICS with LIMA to LAD via left mini thoracotomy   . Statin intolerance 05/26/2016       Surgical History  Past Surgical History:   Procedure Laterality Date   . CARDIAC CATHETERIZATION  05/29/11   [facility]    EF  over 60%   . CARDIAC CATHETERIZATION  12/12/2011  [facility]    with PTCA, stent to LAD   . CORONARY ARTERY BYPASS GRAFT  06/02/2011     HYBRID MICS CABG, LT THORACOTOMY APPROACH, LIMA-LAD, PCI DES-RCA   . JOINT REPLACEMENT      LEFT  KNEE   . KNEE ARTHROSCOPY      BILATERAL   . SPINE SURGERY      ACF X 2       Medications  Current Outpatient Prescriptions   Medication Sig Dispense Refill   . [START ON 11/22/2016] HYDROcodone-acetaminophen (NORCO) 7.5-325 MG per tablet Take 1 tablet by mouth 2 times daily as needed for Pain for up to 30 days. Earliest Fill Date: 11/22/16 60 tablet 0   . diclofenac (PENNSAID) 2 % SOLN Place 2 Squirts onto the skin 2 times daily 2 Bottle 5   . ondansetron (ZOFRAN ODT) 4 MG disintegrating tablet Take 1 tablet by mouth every 8 hours as needed for Nausea or Vomiting 15 tablet 0   . minocycline (MINOCIN;DYNACIN) 100 MG capsule Take 1 capsule by mouth 2 times daily 20 capsule 0   . Alirocumab (PRALUENT SC) Inject into the skin     . celecoxib (CELEBREX) 200 MG capsule Take 1 capsule by mouth 2 times daily 180 capsule 1   . metoprolol tartrate (LOPRESSOR) 25 MG tablet Take 12.5 mg by mouth 2 times daily     . tiZANidine (ZANAFLEX) 4 MG tablet Take 1 tablet by mouth 2 times daily as  needed (one month supply) 45 tablet 2   . esomeprazole Magnesium (NEXIUM) 40 MG PACK Take 40 mg by mouth as needed     . RAPAFLO 8 MG CAPS 8 mg every evening      . nitroGLYCERIN (NITROSTAT) 0.4 MG SL tablet Place 1 tablet under the tongue every 5 minutes as needed for Chest pain up to 3 doses, in no relief report to the ER. 25 tablet 3   . dutasteride (AVODART) 0.5 MG capsule Take 0.5 mg by mouth daily.     Marland Kitchen aspirin 81 MG EC tablet Take 81 mg by mouth daily.         No current facility-administered medications for this encounter.        Allergies  Crestor [rosuvastatin] and Simvastatin    Family History  family history includes Depression in his paternal grandmother; Heart Disease in his father; High Blood Pressure in his father and mother; High Cholesterol in his father.    Social History  Social History     Social History   . Marital status: Married     Spouse name: N/A   . Number of children: N/A   . Years of education: N/A     Social History Main Topics   . Smoking status: Never Smoker   . Smokeless tobacco: Never Used   . Alcohol use Yes      Comment: rarely   . Drug use: No   . Sexual activity: Yes     Partners: Female     Other Topics Concern   . None     Social History Narrative   . None      reports that he does not use drugs.        REVIEW OF SYSTEMS:  ROS         GENERAL PHYSICAL EXAM:  Vitals: BP 137/86   Pulse 61   Temp 97.6 F (36.4 C) (Temporal)   Resp 16   Ht 5\' 7"  (1.702 m)   Wt 176 lb (79.8 kg)   SpO2 98%   BMI 27.57 kg/m , Body mass index is 27.57 kg/m.  Physical Exam Ortho Exam    DATA  Labs:  No results found for: LABBARB, LABBENZ, COCAINESCRN, LABOPIA, LABPHEN  Imaging:  Narrative   MRI LUMBAR SPINE WO CONTRAST    08/13/2016 6:48 PM   History: Back pain and fever after epidural injection.   Normal curvature and alignment.   Normal conus medullaris.   No aortic aneurysm.   No facet joint inflammation or fluid collection is seen.   No epidural abscess or hematoma is seen.    Curvature and alignment is appropriate.   L1-2: Mild facet hypertrophy. No disc abnormality or stenosis.   L2-3: Moderate facet and ligamentous hypertrophy with mild broad-based   disc protrusion. Mild inferior foraminal narrowing.   L3-4: Moderate facet and ligamentous hypertrophy with mild broad-based   disc protrusion resulting in mild to moderate spinal canal stenosis   and mild to moderate foraminal narrowing.   L4-5: Moderate facet and ligamentous hypertrophy with mild disc   protrusion resulting in mild to moderate spinal canal stenosis and   mild-to-moderate foraminal narrowing.   L5-S1: Moderate facet hypertrophy with mild endplate spurring and disc   protrusion. There is moderate to high-grade bilateral foraminal   stenosis. No more than mild spinal canal narrowing.      Impression   1. Multilevel degenerative disc and facet changes with mild to   moderate spinal canal stenosis at L3-4 and L4-5.   2. Foraminal stenosis is greatest at L4-5 and L5-S1.   3. No epidural abscess or hematoma is seen.   Signed by Dr Waylan Boga on 08/13/2016 6:52 PM         ASSESSMENT  Richard Sims is a 66 y.o. male with back pain. Patient rates his pain level a 6/10 on numeric scale today. Discussed with patient about infection in lower back that he went to the ED for. Patient had a  lumbar abscess. Patient is follow with Dr. Wynona Meals for this. Discussed with patient about starting physical therapy for physical function.     On exam, his back is unchanged, moderate tenderness about the upper right SI. His abscess is not describes on the report listed above.    I had reviewed his notes from [facility] and his imaging.    Controlled Substance Monitoring:   Discussed with patient possible medication side effects, risk of tolerance and/or dependence, and alternative treatments. Discussed the growing epidemic in the [place] with the overprescribing and at times the abuse of narcotics. Discussed the detrimental effects of long  term narcotic use in younger patients. Patient encouraged to set daily goals of exercising and decreasing daily narcotic intake. Discussed with the patient about the development of hyperalgesia with long term narcotic intake.      PLAN  1. Continue current dosage of Norco 7.5mg  #60 per kasper fill date  2. Order for physical therapy  3. 1-2 month f/u

## 2016-12-22 ENCOUNTER — Telehealth

## 2016-12-22 ENCOUNTER — Ambulatory Visit
Admit: 2016-12-22 | Discharge: 2016-12-22 | Payer: MEDICARE | Attending: Cardiovascular Disease | Primary: Family Medicine

## 2016-12-22 DIAGNOSIS — E785 Hyperlipidemia, unspecified: Secondary | ICD-10-CM

## 2016-12-22 NOTE — Telephone Encounter (Signed)
Received call from [facility] in [place] that patient had come in with therapy order, but they will require a new referral as the current one expired at the end of March.  New order created and faxed to [facility] at [phone]

## 2016-12-30 NOTE — Progress Notes (Signed)
[facility]  [facility] [address], [name], [address], [place]  [id number]  The following was transcribed by Suella BroadLinda Sims, [name]     Richard Sims Livengood DOB: Jan 07, 1951, 66 y.o. Male        Office Visit:  12/22/2016    Chief Complaint   Patient presents with   . 6 Month Follow-Up     feeling great no complaints. going fishing today.   . Coronary Artery Disease   . Hyperlipidemia     Reason for visit:   1.  Follow up of coronary artery disease and hyperlipidemia.      History of Present Illness:   [name] has no angina or heart failure symptoms.  He recommends Praluent over statin therapy any day.  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      Patient Active Problem List   Diagnosis Code   . CAD (coronary artery disease) I25.10   . Hyperlipidemia E78.5   . Hypertension I10   . S/P CABG x 1 Z95.1   . Chest pain R07.9   . Shortness of breath R06.02   . Lumbar degenerative disc disease M51.36   . Lumbar radiculopathy M54.16   . Low back pain radiating to left leg M54.5   . Lumbar disc disease with radiculopathy M51.16   . H/O right coronary artery stent placement Z95.5   . Lumbar disc disease with radiculopathy M51.16   . Lumbar disc disease with radiculopathy M51.16   . Lumbar disc disease with radiculopathy M51.16   . Lumbar disc disease with radiculopathy M51.16   . Statin intolerance Z78.9   . Myalgia M79.1   . Lumbar disc disease with radiculopathy M51.16     Past Medical History:   Diagnosis Date   . CAD (coronary artery disease)    . Cervical disc disease    . GERD (gastroesophageal reflux disease)    . Hyperlipidemia     [facility] manage his lipid/liver panel   . Hypertension    . Low back pain radiating to left leg 09/11/2014   . Lumbar degenerative disc disease 08/11/2014   . Lumbar radiculopathy 08/11/2014   . Myalgia 05/26/2016   . Nephrolithiasis    . S/P CABG x 1     MICS with LIMA to LAD via left mini  thoracotomy   . Statin intolerance 05/26/2016     Past Surgical History:   Procedure Laterality Date   . CARDIAC CATHETERIZATION  05/29/11   [facility]    EF  over 60%   . [facility]    with [facility]CA, stent to LAD   . CORONARY ARTERY BYPASS GRAFT  06/02/2011     HYBRID MICS CABG, LT THORACOTOMY APPROACH, LIMA-LAD, PCI DES-RCA   . JOINT REPLACEMENT      LEFT KNEE   . KNEE ARTHROSCOPY      BILATERAL   . SPINE SURGERY      ACF X 2      Family History   Problem Relation Age of Onset   . Heart Disease Father    . High Blood Pressure Father    . High Cholesterol Father    . High Blood Pressure Mother    . Depression Paternal Grandmother      Social History   Substance Use Topics   . Smoking status: Never Smoker   . Smokeless tobacco: Never Used   .  Alcohol use Yes      Comment: rarely      Allergies   Allergen Reactions   . Crestor [Rosuvastatin] Other (See Comments)     Severe muscle cramps to legs   . Simvastatin Other (See Comments)     SEVERE MUSCLE CRAMPS     Outpatient Prescriptions Marked as Taking for the 12/22/16 encounter (Office Visit) with Merlinda Wrubel. [name], MD   Medication Sig Dispense Refill   . PRALUENT 75 MG/ML SOPN injection pen INJECT ONE PEN SC Q 2 WEEKS AS DIRECTED  11   . [EXPIRED] HYDROcodone-acetaminophen (NORCO) 7.5-325 MG per tablet Take 1 tablet by mouth 2 times daily as needed for Pain for up to 30 days. Earliest Fill Date: 11/22/16 60 tablet 0   . celecoxib (CELEBREX) 200 MG capsule Take 1 capsule by mouth 2 times daily (Patient taking differently: Take 200 mg by mouth as needed ) 180 capsule 1   . metoprolol tartrate (LOPRESSOR) 25 MG tablet Take 12.5 mg by mouth 2 times daily     . tiZANidine (ZANAFLEX) 4 MG tablet Take 1 tablet by mouth 2 times daily as needed (one month supply) 45 tablet 2   . esomeprazole Magnesium (NEXIUM) 40 MG PACK Take 40 mg by mouth as needed     . RAPAFLO 8 MG CAPS 8 mg every evening      . nitroGLYCERIN (NITROSTAT) 0.4 MG SL tablet Place 1 tablet under  the tongue every 5 minutes as needed for Chest pain up to 3 doses, in no relief report to the ER. 25 tablet 3   . dutasteride (AVODART) 0.5 MG capsule Take 0.5 mg by mouth daily.     Marland Kitchen aspirin 81 MG EC tablet Take 81 mg by mouth daily.         I have reviewed and confirm the Past Medical History, Allergies, and Medications sections above and have updated the computerized patient record.     Data:   BP Readings from Last 3 Encounters:   12/22/16 130/72   11/14/16 137/86   10/23/16 (!) 158/97    Pulse Readings from Last 3 Encounters:   12/22/16 84   11/14/16 61   10/23/16 91        Review of Systems - As per HPI, otherwise negative.  Ten organ review performed.    Physical Exam:  Vital Signs:  BP 130/72   Pulse 84   Ht 5\' 7"  (1.702 m)   Wt 178 lb (80.7 kg)   BMI 27.88 kg/m   Constitutional:  The patient is a pleasant 66 y.o. male in no acute distress.  He appears well-developed and well-nourished.  HEAD:  Normocephalic without evidence of old or recent trauma.  EYES:  Sclerae clear.  Conjunctivae pink.  EOMs intact.  Pupils equal and round.  NOSE:  No nasal discharge or epistaxis.    MOUTH:  Teeth, gums and palate normal.   THROAT:  No lesions on lips or buccal mucosa.  Tongue protrudes in midline and is well papillated.  NECK:  There are no carotid bruits.  No noted jugular venous distention.  No thyromegaly.            CHEST:  Clear bilateral breath sounds without wheezes or rhonchi.    RESPIRATORY:  The lungs are clear.    CARDIOVASCULAR:  The heart's rhythm is regular with a normal rate.  No audible murmurs or gallops.  ABDOMEN:  The abdomen is soft without tenderness or mass.  UPPER EXTREMITY EVALUATION:  Radial pulses palpable bilaterally.      LOWER EXTREMITY EVALUATION:  Negative for peripheral edema.  Femoral, popliteal, dorsalis pedis, and posterior tibialis pulses 2+ to palpation bilaterally.  No cyanosis or clubbing.  MUSCULOSKELETAL:  Normal muscle strength and tone.  No atrophy or  abnormalities.  SKIN:  Warm, dry, intact.  No dermatitis or ulcers.  NEUROLOGIC:  Intact cranial nerves II through XII and no focal weakness.       Impression / Plan:   1.  Coronary artery disease - he is asymptomatic on current therapy.    2.  Hypertension - blood pressure is well controlled on current therapy as listed.  3.  Hyperlipidemia - we are checking a lipid panel in the near future.      4.  Continue current medications.    5.  He will return to see me in six months.                       Orders Placed This Encounter   Procedures   . ALT   . AST   . Lipid Panel     Order Specific Question:   Is Patient Fasting?/# of Hours     Answer:   yes       _____________________________________________________  Electronically Signed by:   Gedeon Brandow. [name], M.D., F.A.Arionna Hoggard.Jeff Frieden.   [facility]  _____________________________________________________  Copy:  Keene Breath, MD

## 2017-01-21 MED ORDER — METOPROLOL TARTRATE 25 MG PO TABS
25 MG | ORAL_TABLET | ORAL | 3 refills | Status: DC
Start: 2017-01-21 — End: 2017-12-09

## 2017-01-23 ENCOUNTER — Inpatient Hospital Stay: Attending: Nurse Practitioner | Primary: Family Medicine

## 2017-02-04 ENCOUNTER — Encounter

## 2017-02-05 MED ORDER — CELECOXIB 200 MG PO CAPS
200 | ORAL_CAPSULE | Freq: Two times a day (BID) | ORAL | 1 refills | Status: DC
Start: 2017-02-05 — End: 2017-12-09

## 2017-02-17 ENCOUNTER — Encounter: Attending: Family | Primary: Family Medicine

## 2017-04-20 ENCOUNTER — Emergency Department: Admit: 2017-04-20 | Payer: MEDICARE | Primary: Family Medicine

## 2017-04-20 ENCOUNTER — Inpatient Hospital Stay: Admit: 2017-04-20 | Discharge: 2017-04-20 | Disposition: A | Discharge status: Home / Self Care | Payer: MEDICARE

## 2017-04-20 DIAGNOSIS — R079 Chest pain, unspecified: Secondary | ICD-10-CM

## 2017-04-20 LAB — TROPONIN
Troponin: <0.01 ng/mL (ref 0.00–0.03)
Troponin: <0.01 ng/mL (ref 0.00–0.03)

## 2017-04-20 LAB — COMPREHENSIVE METABOLIC PANEL
ALT: 28 U/L (ref 5–41)
AST: 27 U/L (ref 5–40)
Albumin: 4.9 g/dL (ref 3.5–5.2)
Alkaline Phosphatase: 76 U/L (ref 40–130)
Anion Gap: 13 mmol/L (ref 7–19)
BUN: 17 mg/dL (ref 8–23)
CO2: 28 mmol/L (ref 22–29)
Calcium: 9.8 mg/dL (ref 8.8–10.2)
Chloride: 101 mmol/L (ref 98–111)
Creatinine: 1 mg/dL (ref 0.5–1.2)
GFR Non-African American: >60 (ref >60)
Glucose: 116 mg/dL — ABNORMAL HIGH (ref 74–109)
Potassium: 4.1 mmol/L (ref 3.5–5.0)
Sodium: 142 mmol/L (ref 136–145)
Total Bilirubin: 0.6 mg/dL (ref 0.2–1.2)
Total Protein: 7.7 g/dL (ref 6.6–8.7)

## 2017-04-20 LAB — CBC WITH AUTO DIFFERENTIAL
Basophils %: 1.7 % — ABNORMAL HIGH (ref 0.0–1.0)
Basophils Absolute: 0.1 10*3/uL (ref 0.00–0.20)
Eosinophils %: 2.9 % (ref 0.0–5.0)
Eosinophils Absolute: 0.2 10*3/uL (ref 0.00–0.60)
Hematocrit: 45.8 % (ref 42.0–52.0)
Hemoglobin: 15.5 g/dL (ref 14.0–18.0)
Lymphocytes %: 25.3 % (ref 20.0–40.0)
Lymphocytes Absolute: 1.5 10*3/uL (ref 1.1–4.5)
MCH: 30 pg (ref 27.0–31.0)
MCHC: 33.8 g/dL (ref 33.0–37.0)
MCV: 88.6 fL (ref 80.0–94.0)
MPV: 9.9 fL (ref 9.4–12.4)
Monocytes %: 7.4 % (ref 0.0–10.0)
Monocytes Absolute: 0.4 10*3/uL (ref 0.00–0.90)
Neutrophils %: 62.5 % (ref 50.0–65.0)
Neutrophils Absolute: 3.7 10*3/uL (ref 1.5–7.5)
Platelets: 162 10*3/uL (ref 130–400)
RBC: 5.17 M/uL (ref 4.70–6.10)
RDW: 12.9 % (ref 11.5–14.5)
WBC: 5.9 10*3/uL (ref 4.8–10.8)

## 2017-04-20 LAB — ECHOCARDIOGRAM EXERCISE STRESS TEST: Left Ventricular Ejection Fraction: 50

## 2017-04-20 MED ORDER — ASPIRIN 325 MG PO TABS
325 MG | Freq: Once | ORAL | Status: AC
Start: 2017-04-20 — End: 2017-04-20
  Administered 2017-04-20: 13:00:00 325 mg via ORAL

## 2017-04-20 MED ORDER — NITROGLYCERIN 0.4 MG SL SUBL
0.4 MG | SUBLINGUAL | Status: DC | PRN
Start: 2017-04-20 — End: 2017-04-20

## 2017-04-20 MED FILL — ASPIRIN 325 MG PO TABS: 325 MG | ORAL | Qty: 1

## 2017-04-20 NOTE — Consults (Signed)
[facility]      Cardiology Consultation                 Date of Admission:  04/20/2017  8:06 AM    Date of Initially Being Seen / Consultation:  04/20/17    Cardiologist:  Dr. [name]    Cardiology Attending: Dr. Felicity Pellegrini Attending: ER physician     PCP:  Keene Breath, MD    Reason for Consultation or Admission / Chief Complaint:  Chest pain and hypertension    SUBJECTIVE AND HISTORY OF PRESENT ILLNESS:    Source of the history:  Patient, family, previous inpatient and outpatient records in [facility].    Richard Sims is a 66 y.o. male who presents to [facility] ER with symptoms / signs / problem or diagnosis of chest pressure and hypertension.  Patient states for the past week he has noticed his blood pressure fluctuating. This morning when he work up he had substernal chest pressure with radiation down his left arm. He had mild shortness of breath with this. He has noticed this intermittently over the past 4 days. When he checked his blood pressure it was elevated and instead of his normal 12.5 mg of metoprolol dose he took a 25 mg dose. With his symptoms patient came to the ER for further evaluation and treatment. Patient states his symptoms are similar to those he has had in the past prior to coronary stents. He currently denies chest pain, pressure, and tightness. He denied nausea, vomiting, diaphoresis, PND, orthopnea, edema, palpitations, near syncope, and syncope.        Family present:  No      CARDIAC RISK PROFILE:    Risk Factor Yes / No / Unknown       Gender Male   Cigarette Use No   Family History of Cardiovascular Disease Yes: mother and father   Diabetes Mellitus no   Hypercholesteremia yes   Hypertension yes          Cardiac Specific Problems:    Specialty Problems        Cardiology Problems    CAD (coronary artery disease)        Hypertension                PRIOR CARDIAC PROBLEM LIST  (IF APPLICABLE):          Past Medical  History:    Past Medical History:   Diagnosis Date   . CAD (coronary artery disease)    . Cervical disc disease    . GERD (gastroesophageal reflux disease)    . Hyperlipidemia     [facility] manage his lipid/liver panel   . Hypertension    . Low back pain radiating to left leg 09/11/2014   . Lumbar degenerative disc disease 08/11/2014   . Lumbar radiculopathy 08/11/2014   . Myalgia 05/26/2016   . Nephrolithiasis    . S/P CABG x 1     MICS with LIMA to LAD via left mini thoracotomy   . Statin intolerance 05/26/2016         Past Surgical History:    Past Surgical History:   Procedure Laterality Date   . CARDIAC CATHETERIZATION  05/29/11   [facility]    EF  over 60%   . CARDIAC CATHETERIZATION  12/12/2011  [facility]    with PTCA, stent to LAD   . CORONARY ARTERY BYPASS GRAFT  06/02/2011  HYBRID MICS CABG, LT THORACOTOMY APPROACH, LIMA-LAD, PCI DES-RCA   . JOINT REPLACEMENT      LEFT KNEE   . KNEE ARTHROSCOPY      BILATERAL   . SPINE SURGERY      ACF X 2         Home Medications:   Prior to Admission medications    Medication Sig Start Date End Date Taking? Authorizing Provider   celecoxib (CELEBREX) 200 MG capsule Take 1 capsule by mouth 2 times daily (3 month supply) 02/05/17   Beatrix Fetters, MD   metoprolol tartrate (LOPRESSOR) 25 MG tablet TAKE 1/2 TABLET TWICE DAILY 01/21/17   Patsi Sears, APRN   PRALUENT 75 MG/ML SOPN injection pen INJECT ONE PEN SC Q 2 WEEKS AS DIRECTED 12/10/16   Historical Provider, MD   tiZANidine (ZANAFLEX) 4 MG tablet Take 1 tablet by mouth 2 times daily as needed (one month supply) 04/30/16   Beatrix Fetters, MD   esomeprazole Magnesium (NEXIUM) 40 MG PACK Take 40 mg by mouth as needed    Historical Provider, MD   RAPAFLO 8 MG CAPS 8 mg every evening  07/11/14   Historical Provider, MD   nitroGLYCERIN (NITROSTAT) 0.4 MG SL tablet Place 1 tablet under the tongue every 5 minutes as needed for Chest pain up to 3 doses, in no relief report to the ER. 08/31/14   Tally Due, APRN - CNP   dutasteride  (AVODART) 0.5 MG capsule Take 0.5 mg by mouth daily.    Historical Provider, MD   aspirin 81 MG EC tablet Take 81 mg by mouth daily.      Historical Provider, MD        Facility Administered Medications:     Allergies:  Crestor [rosuvastatin] and Simvastatin     Social History:       Social History     Social History   . Marital status: Married     Spouse name: N/A   . Number of children: N/A   . Years of education: N/A     Occupational History   . Not on file.     Social History Main Topics   . Smoking status: Never Smoker   . Smokeless tobacco: Never Used   . Alcohol use Yes      Comment: rarely   . Drug use: No   . Sexual activity: Yes     Partners: Female     Other Topics Concern   . Not on file     Social History Narrative   . No narrative on file       Family History:     Family History   Problem Relation Age of Onset   . Heart Disease Father    . High Blood Pressure Father    . High Cholesterol Father    . High Blood Pressure Mother    . Depression Paternal Grandmother          REVIEW OF SYSTEMS:     Except as noted in the HPI, all other systems are negative        PHYSICAL EXAMINATION:     BP (!) 164/90   Pulse 63   Temp 98.1 F (36.7 Margurette Brener) (Oral)   Resp 18   Ht 5\' 7"  (1.702 m)   Wt 183 lb (83 kg)   SpO2 96%   BMI 28.66 kg/m     GENERAL - well developed and well nourished, in no  amount of generalized distress; is an active participant in this examination  HEENT -  PERRLA, Hearing appears normal, conjunctiva and lids are normal, ears and nose appear normal  NECK - no thyromegaly, no JVD, trachea is in the midline  CARDIOVASCULAR - PMI is in the left mid line clavicular position, Normal S1 and S2 without systolic murmur.  No S3 or S4    PULMONARY - No respiratory distress.  No wheezes and rales.  Breath sounds in both  lung fields are Normal  ABDOMEN  - soft, non tender, no rebound, no hepatomegaly or splenomegaly  MUSCULOSKELETAL  - Sitting, digitals and nails are without clubbing or  cyanosis  EXTREMITIES - No edema  NEUROLOGIC - cranial nerves, II-XII, are normal  SKIN - turgor is normal, no rash  PSYCHIATRIC - normal mood and affect, alert and orientated x 3, judgement and insight appear appropriate      LABORATORY EVALUATION & TESTING:    I have personally reviewed and interpreted the results of the following diagnostic testing      EKG and or Telemetry:  which was personally reviewed me:  Sinus rhythm, 82 bpm,  without Acute changes    Troponin:  negative myocardial necrosis; the creatinine is normal    CBC:   Recent Labs      04/20/17   0810   WBC  5.9   HGB  15.5   HCT  45.8   MCV  88.6   PLT  162     BMP:   Recent Labs      04/20/17   0810   NA  142   K  4.1   CL  101   CO2  28   BUN  17   CREATININE  1.0     Cardiac Enzymes:   Recent Labs      04/20/17   0810  04/20/17   0955   TROPONINI  <0.01  <0.01     PT/INR: No results for input(s): PROTIME, INR in the last 72 hours.  APTT: No results for input(s): APTT in the last 72 hours.  Liver Profile:  Lab Results   Component Value Date    AST 27 04/20/2017    ALT 28 04/20/2017    BILITOT 0.6 04/20/2017    ALKPHOS 76 04/20/2017     Lab Results   Component Value Date    CHOL 169 05/30/2011    HDL 36 05/30/2011    TRIG 756 05/30/2011     TSH:  No results found for: TSH  UA:   Lab Results   Component Value Date    COLORU YELLOW 08/13/2016    PHUR 7.0 08/13/2016    WBCUA 1 08/13/2016    RBCUA 25 08/13/2016    BACTERIA NEGATIVE 08/13/2016    CLARITYU Clear 08/13/2016    SPECGRAV 1.021 08/13/2016    LEUKOCYTESUR Negative 08/13/2016    UROBILINOGEN 1.0 08/13/2016    BILIRUBINUR Negative 08/13/2016    BLOODU MODERATE 08/13/2016    GLUCOSEU Negative 08/13/2016             ALL THE CARDIOLOGY PROBLEMS ARE LISTED ABOVE; HOWEVER, THE FOLLOWING SPECIFIC CARDIAC PROBLEMS WERE ADDRESSED AND TREATED DURING THE HOSPITAL VISIT TODAY:  MEDICAL DECISION MAKING             Cardiac Specific Problem / Diagnosis  Discussion and Data Reviewed Diagnostic Procedures Ordered Management Options Selected           1. Presenting problem / symptom    Chest pain  Initial encounter   EKG without acute change. Troponin negative.  Yes: Echo stress test Continue current medications:     Yes           2. CAD Initial presentation during this evaluation   Review and summation of old records:    12/12/2011 Cath- PCI to LAD  06/02/2011 CABG- HYBRID MICS CABG, LT THORACOTOMY APPROACH, LIMA-LAD, PCI DES-RCA   05/29/2011 cath- severe CAD   Yes: Echo stress test Continue current medications:    Yes           3. Hypertension Initial presentation during this evaluation Systolic (36hrs), Avg:174 , Min:164 , Max:193    Diastolic (36hrs), Avg:100, Min:90, Max:110   No Continue current medications:       yes         PLAN:    1. Continue present medications except for changes as noted above  2. Continue to monitor rhythm  3. Further orders per clinical course.  4. Echo stress test           Discussed with patient and nursing.    I greatly appreciate the opportunity and your confidence in allowing me to participate in the care of Richard Sims    Electronically signed by Daneil Dan, APRN - CNP on 04/20/17     [facility]    I have reviewed and discussed Richard Sims with the above Nurse practitioner.  I have seen and examined patient and agree with above assessment and plan.    Subjective: chest discomfort      Objective: blood pressure elevated. Cardiac enzymes negative no acute ECG changes.      Plan: agree with stress echocardiogram with further plans depending upon the result of that procedure.    Electronically signed:  Oral Remache. [name], MD

## 2017-04-20 NOTE — ED Notes (Signed)
Portable radiology at bedside     Edwyna Shell, RN  04/20/17 [phone]

## 2017-04-20 NOTE — ED Notes (Signed)
Coffee provided for pt family     Richard Sims, [place]  04/20/17 [phone]

## 2017-04-20 NOTE — ED Notes (Signed)
ASSESSMENT:  Pt co CP x 3 hrs.  SKIN:  Warm, dry, pink. Cap refill < 2 sec  CARDIAC:  S1 S2 noted  LUNGS: clear upper and lower lobes. Respirations even and unlabored.  ABDOMEN: bowel sounds noted upper and lower quadrants. Soft and tender.  EXTREMITIES: bilateral DP and PT. No edema noted.    Pt alert and oriented x4.  Pupils equal and reactive. No distress noted. Side rails up and call light within reach.       Nadean Corwin, RN  04/20/17 [phone]

## 2017-04-20 NOTE — ED Provider Notes (Signed)
eMERGENCY dEPARTMENT eNCOUnter      Pt Name: Richard Sims  MRN: [id number]  Birthdate [birth year]  Date of evaluation: 04/20/2017  Provider: Caroleen Hamman, PA    CHIEF COMPLAINT       Chief Complaint   Patient presents with   . Chest Pain         HISTORY OF PRESENT ILLNESS  (Location/Symptom, Timing/Onset, Context/Setting, Quality, Duration, Modifying Factors, Severity.)   Richard Sims is a 66 y.o. male who presents to the emergency department With chest pain.  Patient states he's had elevated blood pressure over the past one week.  He has some pressure substernally radiating into the left arm.  No nausea vomiting diaphoresis or syncope.  He denies any cough congestion or fevers.  Prior history of CABG.  He take an aspirin last night.  Some shortness of breath with exertion.  Patient states he has been working a lot outside recently.      Nursing Notes were reviewed and I agree.    REVIEW OF SYSTEMS    (2-9 systems for level 4, 10 or more for level 5)     Review of Systems   Constitutional: Negative for chills, fatigue and fever.   HENT: Negative for congestion, rhinorrhea, sneezing and sore throat.    Respiratory: Negative for cough, chest tightness, shortness of breath, wheezing and stridor.    Cardiovascular: Positive for chest pain.   Gastrointestinal: Negative for abdominal distention, abdominal pain, constipation, nausea and vomiting.   Genitourinary: Negative for dysuria, flank pain and hematuria.   Musculoskeletal: Negative for arthralgias, back pain, joint swelling and neck pain.   Skin: Negative for color change and rash.   Neurological: Negative for dizziness, syncope and headaches.   Psychiatric/Behavioral: Negative for confusion.        Except as noted above the remainder of the review of systems was reviewed and negative.       PAST MEDICAL HISTORY     Past Medical History:   Diagnosis Date   . CAD (coronary artery disease)    . Cervical disc disease    . GERD (gastroesophageal reflux disease)     . Hyperlipidemia     [facility] manage his lipid/liver panel   . Hypertension    . Low back pain radiating to left leg 09/11/2014   . Lumbar degenerative disc disease 08/11/2014   . Lumbar radiculopathy 08/11/2014   . Myalgia 05/26/2016   . Nephrolithiasis    . S/P CABG x 1     MICS with LIMA to LAD via left mini thoracotomy   . Statin intolerance 05/26/2016         SURGICAL HISTORY       Past Surgical History:   Procedure Laterality Date   . CARDIAC CATHETERIZATION  05/29/11   [facility]    EF  over 60%   . CARDIAC CATHETERIZATION  12/12/2011  [facility]    with PTCA, stent to LAD   . CORONARY ARTERY BYPASS GRAFT  06/02/2011     HYBRID MICS CABG, LT THORACOTOMY APPROACH, LIMA-LAD, PCI DES-RCA   . JOINT REPLACEMENT      LEFT KNEE   . KNEE ARTHROSCOPY      BILATERAL   . SPINE SURGERY      ACF X 2         CURRENT MEDICATIONS       Discharge Medication List as of 04/20/2017  1:28 PM      CONTINUE these medications which  have NOT CHANGED    Details   celecoxib (CELEBREX) 200 MG capsule Take 1 capsule by mouth 2 times daily (3 month supply), Disp-180 capsule, R-1Normal      metoprolol tartrate (LOPRESSOR) 25 MG tablet TAKE 1/2 TABLET TWICE DAILY, Disp-90 tablet, R-3Normal      PRALUENT 75 MG/ML SOPN injection pen INJECT ONE PEN SC Q 2 WEEKS AS DIRECTED, R-11, DAWHistorical Med      tiZANidine (ZANAFLEX) 4 MG tablet Take 1 tablet by mouth 2 times daily as needed (one month supply), Disp-45 tablet, R-2Normal      esomeprazole Magnesium (NEXIUM) 40 MG PACK Take 40 mg by mouth as needed      RAPAFLO 8 MG CAPS 8 mg every evening , DAW      nitroGLYCERIN (NITROSTAT) 0.4 MG SL tablet Place 1 tablet under the tongue every 5 minutes as needed for Chest pain up to 3 doses, in no relief report to the ER., Disp-25 tablet, R-3      dutasteride (AVODART) 0.5 MG capsule Take 0.5 mg by mouth daily.      aspirin 81 MG EC tablet Take 81 mg by mouth daily.               ALLERGIES     Crestor [rosuvastatin] and Simvastatin    FAMILY HISTORY        Family History   Problem Relation Age of Onset   . Heart Disease Father    . High Blood Pressure Father    . High Cholesterol Father    . High Blood Pressure Mother    . Depression Paternal Grandmother           SOCIAL HISTORY       Social History     Social History   . Marital status: Married     Spouse name: N/A   . Number of children: N/A   . Years of education: N/A     Social History Main Topics   . Smoking status: Never Smoker   . Smokeless tobacco: Never Used   . Alcohol use Yes      Comment: rarely   . Drug use: No   . Sexual activity: Yes     Partners: Female     Other Topics Concern   . None     Social History Narrative   . None       SCREENINGS           PHYSICAL EXAM    (up to 7 for level 4, 8 or more for level 5)     ED Triage Vitals [04/20/17 0804]   BP Temp Temp Source Pulse Resp SpO2 Height Weight   (!) 193/110 98.1 F (36.7 C) Oral 63 18 96 % 5\' 7"  (1.702 m) 183 lb (83 kg)       Physical Exam   Constitutional: He is oriented to person, place, and time. He appears well-developed and well-nourished. No distress.   HENT:   Head: Normocephalic and atraumatic.   Neck: Normal range of motion.   Cardiovascular: Normal rate, regular rhythm and normal heart sounds.  Exam reveals no gallop and no friction rub.    No murmur heard.  Pulmonary/Chest: Effort normal and breath sounds normal. No respiratory distress. He has no wheezes. He has no rales.         He exhibits no tenderness.   Abdominal: Soft. Bowel sounds are normal. He exhibits no distension. There is no tenderness. There is  no rebound and no guarding.   Lymphadenopathy:     He has no cervical adenopathy.   Neurological: He is alert and oriented to person, place, and time.   Skin: Skin is warm and dry. He is not diaphoretic.   Psychiatric: He has a normal mood and affect.   Nursing note and vitals reviewed.        DIAGNOSTIC RESULTS     RADIOLOGY:   Non-plain film images such as CT, Ultrasound and MRI are read by the radiologist.    Interpretation per the Radiologist below, if available at the time of this note:    XR CHEST PORTABLE   Final Result   The poor lung expansion but no active cardiopulmonary   disease.   The above finding are recorded on a digital voice clip in PACS.   Signed by Dr Nila Nephew on 04/20/2017 8:47 AM          LABS:  Labs Reviewed   CBC WITH AUTO DIFFERENTIAL - Abnormal; Notable for the following:        Result Value    Basophils % 1.7 (*)     All other components within normal limits   COMPREHENSIVE METABOLIC PANEL - Abnormal; Notable for the following:     Glucose 116 (*)     All other components within normal limits   TROPONIN   TROPONIN       All other labs were within normal range or not returned as of this dictation.    EMERGENCY DEPARTMENT COURSE and DIFFERENTIAL DIAGNOSIS/MDM:   Vitals:    Vitals:    04/20/17 1000 04/20/17 1100 04/20/17 1300 04/20/17 1417   BP:   (!) 160/82 (!) 154/74   Pulse:   66 64   Resp: 18 18 18 18    Temp:    98 F (36.7 C)   TempSrc:       SpO2:   96% 98%   Weight:       Height:               MDM  Number of Diagnoses or Management Options  Chest pain, unspecified type:   Essential hypertension:   Diagnosis management comments: EKG sinus rhythm.  No evidence of ST elevation or depressions.  Patient was sent back for echo stress test due to his cardiac history.  Echo stress was negative for any acute abnormalities.  Advised patient to follow up with his PCP.  Continue taking daily aspirin.  Discussed with his PCP has hypertension medication regimen.  Stable ready for discharge.      PROCEDURES:    Procedures      FINAL IMPRESSION      1. Chest pain, unspecified type    2. Essential hypertension          DISPOSITION/PLAN   DISPOSITION Decision To Discharge 04/20/2017 02:17:22 PM      Patient was told that if symptoms worsen or new symptoms develop they are to return to the emergency department immediately. Patient was educated on diagnosis and treatment plan.  All of patient's questions  were answered, and the patient understands the discharge plan. I do not feel the patient has a life-threatening condition at this time.  Patient is to be discharged.      PATIENT REFERRED TO:  Judene Companion, MD  [address] [place] [address] [place] [address]  [phone]    Schedule an appointment as soon as possible for a visit  DISCHARGE MEDICATIONS:  Discharge Medication List as of 04/20/2017  1:28 PM          (Please note that portions of this note were completed with a voice recognition program.  Efforts were made to edit the dictations but occasionally words are mis-transcribed.)    Caroleen Hamman, PA        [employer], [place]  04/20/17 1434

## 2017-04-20 NOTE — Progress Notes (Signed)
Cardiology:    The stress echocardiogram appears negative for inducible ischemia. He had no chest pain on the treadmill. Target heart rate was attained.    The cause of his symptoms is not known but appears to have a low probability of being due to myocardial ischemia. I asked him to increase his metoprolol from 12.5-25 mg twice daily for his hypertension. He will also keep track of his blood pressure and call us if it does not normalize. He is to call for any further questions or concerns and will keep regularly scheduled office visit.

## 2017-04-23 LAB — EKG 12-LEAD
P Axis: 34 degrees
P Axis: 63 degrees
P-R Interval: 168 ms
P-R Interval: 192 ms
Q-T Interval: 420 ms
Q-T Interval: 428 ms
QRS Duration: 100 ms
QRS Duration: 76 ms
QTc Calculation (Bazett): 427 ms
QTc Calculation (Bazett): 427 ms
T Axis: 26 degrees
T Axis: 58 degrees

## 2017-06-02 MED ORDER — PRALUENT 75 MG/ML SC SOPN
75 MG/ML | PEN_INJECTOR | SUBCUTANEOUS | 11 refills | Status: DC
Start: 2017-06-02 — End: 2018-05-11

## 2017-06-23 ENCOUNTER — Ambulatory Visit
Admit: 2017-06-23 | Discharge: 2017-06-23 | Payer: MEDICARE | Attending: Clinical Nurse Specialist | Primary: Family Medicine

## 2017-06-23 DIAGNOSIS — I2510 Atherosclerotic heart disease of native coronary artery without angina pectoris: Secondary | ICD-10-CM

## 2017-06-23 MED ORDER — NITROGLYCERIN 0.4 MG SL SUBL
0.4 | ORAL_TABLET | SUBLINGUAL | 3 refills | Status: DC | PRN
Start: 2017-06-23 — End: 2017-12-09

## 2017-06-23 NOTE — Progress Notes (Signed)
[facility], [place]  [address], [place]  [address]  Phone: [phone]  Fax: [phone]    OFFICE VISIT:  06/23/2017    Richard Sims - DOB: 10/23/1950    Reason For Visit:  Richard Sims is a 66 y.o. male who is here for 6 Month Follow-Up (no cardiac symptoms) and Coronary Artery Disease    HPI  Patient is having no further leg pain now that he has started Praluent for his cholesterol. He states his cholesterol numbers are looking great.  He may be moving to [place] in the future  Coronary Artery Disease   Presents for follow-up visit. Pertinent negatives include no chest pain, chest pressure, chest tightness, dizziness, leg swelling, muscle weakness, palpitations, shortness of breath or weight gain. The symptoms have been stable. Compliance with diet is good. Compliance with exercise is good. Compliance with medications is good.        Keene Breath, MD is PCP.  Richard Sims has the following history as recorded in EpicCare:    Patient Active Problem List    Diagnosis Date Noted   . Lumbar disc disease with radiculopathy 08/11/2016   . Statin intolerance 05/26/2016   . Myalgia 05/26/2016   . Lumbar disc disease with radiculopathy 04/30/2016   . Lumbar disc disease with radiculopathy 11/15/2015   . Lumbar disc disease with radiculopathy 08/15/2015   . Lumbar disc disease with radiculopathy 05/16/2015   . H/O right coronary artery stent placement 05/09/2015   . Lumbar disc disease with radiculopathy 01/15/2015   . Low back pain radiating to left leg 09/11/2014   . Lumbar degenerative disc disease 08/11/2014   . Lumbar radiculopathy 08/11/2014   . Chest pain 12/09/2011   . Shortness of breath 12/09/2011   . S/P CABG x 1    . CAD (coronary artery disease)    . Hyperlipidemia    . Hypertension      Past Medical History:   Diagnosis Date   . CAD (coronary artery disease)    . Cervical disc disease    . GERD (gastroesophageal reflux disease)    . Hyperlipidemia      [facility] manage his lipid/liver panel   . Hypertension    . Low back pain radiating to left leg 09/11/2014   . Lumbar degenerative disc disease 08/11/2014   . Lumbar radiculopathy 08/11/2014   . Myalgia 05/26/2016   . Nephrolithiasis    . S/P CABG x 1     MICS with LIMA to LAD via left mini thoracotomy   . Statin intolerance 05/26/2016     Past Surgical History:   Procedure Laterality Date   . CARDIAC CATHETERIZATION  05/29/11   [facility]    EF  over 60%   . CARDIAC CATHETERIZATION  12/12/2011  [facility]    with PTCA, stent to LAD   . CORONARY ARTERY BYPASS GRAFT  06/02/2011     HYBRID MICS CABG, LT THORACOTOMY APPROACH, LIMA-LAD, PCI DES-RCA   . JOINT REPLACEMENT      LEFT KNEE   . KNEE ARTHROSCOPY      BILATERAL   . SPINE SURGERY      ACF X 2     Family History   Problem Relation Age of Onset   . Heart Disease Father    . High Blood Pressure Father    . High Cholesterol Father    . High Blood Pressure Mother    .  Depression Paternal Grandmother      Social History   Substance Use Topics   . Smoking status: Never Smoker   . Smokeless tobacco: Never Used   . Alcohol use Yes      Comment: rarely      Current Outpatient Prescriptions   Medication Sig Dispense Refill   . nitroGLYCERIN (NITROSTAT) 0.4 MG SL tablet Place 1 tablet under the tongue every 5 minutes as needed for Chest pain up to 3 doses, in no relief report to the ER. 25 tablet 3   . PRALUENT 75 MG/ML SOPN injection pen INJECT ONE PEN UNDER THE SKIN EVERY 2 WEEKS AS DIRECTED 2 pen 11   . celecoxib (CELEBREX) 200 MG capsule Take 1 capsule by mouth 2 times daily (3 month supply) 180 capsule 1   . metoprolol tartrate (LOPRESSOR) 25 MG tablet TAKE 1/2 TABLET TWICE DAILY 90 tablet 3   . tiZANidine (ZANAFLEX) 4 MG tablet Take 1 tablet by mouth 2 times daily as needed (one month supply) 45 tablet 2   . esomeprazole Magnesium (NEXIUM) 40 MG PACK Take 40 mg by mouth as needed     . RAPAFLO 8 MG CAPS 8 mg every evening      . dutasteride (AVODART) 0.5 MG capsule  Take 0.5 mg by mouth daily.     Marland Kitchen. aspirin 81 MG EC tablet Take 81 mg by mouth daily.         No current facility-administered medications for this visit.      Allergies: Crestor [rosuvastatin] and Simvastatin    Review of Systems  Review of Systems   Constitutional: Negative for chills, fever, malaise/fatigue and weight gain.   HENT: Negative for nosebleeds.    Eyes: Negative for blurred vision.   Respiratory: Negative for cough, chest tightness and shortness of breath.    Cardiovascular: Negative for chest pain, palpitations, orthopnea, leg swelling and PND.   Gastrointestinal: Negative for heartburn, nausea and vomiting.   Musculoskeletal: Positive for myalgias. Negative for falls and muscle weakness.   Skin: Negative for rash.   Neurological: Negative for dizziness, sensory change, speech change, focal weakness and headaches.   Endo/Heme/Allergies: Does not bruise/bleed easily.   Psychiatric/Behavioral: Negative for depression. The patient is not nervous/anxious.        Objective  Vital Signs - BP 138/78   Pulse 56   Ht 5\' 7"  (1.702 m)   Wt 184 lb (83.5 kg)   BMI 28.82 kg/m   Physical Exam   Constitutional: He is oriented to person, place, and time. He appears well-developed and well-nourished. No distress.   HENT:   Head: Normocephalic and atraumatic.   Eyes: Pupils are equal, round, and reactive to light. Right eye exhibits no discharge. Left eye exhibits no discharge.   Neck: No JVD present. No tracheal deviation present.   Cardiovascular: Normal rate, regular rhythm, normal heart sounds and intact distal pulses.  Exam reveals no gallop and no friction rub.    No murmur heard.  No carotid bruit   Pulmonary/Chest: Effort normal and breath sounds normal. No respiratory distress. He has no wheezes. He has no rales.   Abdominal: Soft. There is no tenderness.   Musculoskeletal: He exhibits no edema.   Normal gait and station   Neurological: He is alert and oriented to person, place, and time. No cranial nerve  deficit.   Skin: Skin is warm and dry. No rash noted.   Psychiatric: He has a normal mood and affect. His  behavior is normal. Judgment normal.   Nursing note and vitals reviewed.      Assessment:     Diagnosis Orders   1. Coronary artery disease involving native coronary artery of native heart without angina pectoris     2. Essential hypertension     3. Mixed hyperlipidemia     4. Statin intolerance       Patient is taking medications as prescribed    CAD-stable without angina    Hypertension-well controlled current regimen    Hyperlipidemia/statin intolerance-doing well with Praluent. Recent lipids reviewed from May which are at goal    Stable cardiovascular status. No evidence of overt heart failure, angina or dysrhythmia.     Plan    Followup With APRN 6 Sims    Call with any questions or concerns  Follow up with Keene Breath, MD for non cardiac problems  Report any new problems  Cardiovascular Fitness-Exercise as tolerated.  Strive for 15 minutes of exercise most days of the week.    Cardiac / Healthy Diet  Continue current medications as directed  Continue plan of treatment  It is always recommended that you bring your medications bottles with you to each visit - this is for your safety!       Renaldo Fiddler, APRN

## 2017-06-23 NOTE — Patient Instructions (Addendum)
Followup With APRN 6 mo    Call with any questions or concerns  Follow up with Keene BreathAshley Flanary Jessup, MD for non cardiac problems  Report any new problems  Cardiovascular Fitness-Exercise as tolerated.  Strive for 15 minutes of exercise most days of the week.    Cardiac / Healthy Diet  Continue current medications as directed  Continue plan of treatment  It is always recommended that you bring your medications bottles with you to each visit - this is for your safety!       Patient Education        Learning About Coronary Artery Disease (CAD)  What is coronary artery disease?    Coronary artery disease (CAD) occurs when plaque builds up in the arteries that bring oxygen-rich blood to your heart. Plaque is a fatty substance made of cholesterol, calcium, and other substances in the blood. This process is called hardening of the arteries, or atherosclerosis.  What happens when you have coronary artery disease?   Plaque may narrow the coronary arteries. Narrowed arteries cause poor blood flow. This can lead to angina symptoms such as chest pain or discomfort. If blood flow is completely blocked, you could have a heart attack.   You can slow CAD and reduce the risk of future problems by making changes in your lifestyle. These include quitting smoking and eating heart-healthy foods.   Treatments for CAD, along with changes in your lifestyle, can help you live a longer and healthier life.  How can you prevent coronary artery disease?   Do not smoke. It may be the best thing you can do to prevent heart disease. If you need help quitting, talk to your doctor about stop-smoking programs and medicines. These can increase your chances of quitting for good.   Be active. Get at least 30 minutes of exercise on most days of the week. Walking is a good choice. You also may want to do other activities, such as running, swimming, cycling, or playing tennis or team sports.   Eat heart-healthy foods. Eat more fruits and vegetables  and less foods that contain saturated and trans fats. Limit alcohol, sodium, and sweets.   Stay at a healthy weight. Lose weight if you need to.   Manage other health problems such as diabetes, high blood pressure, and high cholesterol.   Manage stress. Stress can hurt your heart. To keep stress low, talk about your problems and feelings. Don't keep your feelings hidden.   If you have talked about it with your doctor, take a low-dose aspirin every day. Aspirin can help certain people lower their risk of a heart attack or stroke. But taking aspirin isn't right for everyone, because it can cause serious bleeding. Do not start taking daily aspirin unless your doctor knows about it.  How is coronary artery disease treated?   Your doctor will suggest that you make lifestyle changes. For example, your doctor may ask you to eat healthy foods, quit smoking, lose extra weight, and be more active.   You will have to take medicines.   Your doctor may suggest a procedure to open narrowed or blocked arteries. This is called angioplasty. Or your doctor may suggest using healthy blood vessels to create detours around narrowed or blocked arteries. This is called bypass surgery.  Follow-up care is a key part of your treatment and safety. Be sure to make and go to all appointments, and call your doctor if you are having problems. It's also a good idea  to know your test results and keep a list of the medicines you take.  Where can you learn more?  Go to [email or web address] and sign in to your MyChart account. Enter C643 in the Search Health Information box to learn more about "Learning About Coronary Artery Disease (CAD)."     If you do not have an account, please click on the "Sign Up Now" link.  Current as of: July 30, 2016  Content Version: 11.7   2006-2018 [employer]. Care instructions adapted under license by [facility]. If you have questions about a medical condition or this  instruction, always ask your healthcare professional. [employer] disclaims any warranty or liability for your use of this information.

## 2017-08-03 MED ORDER — ESOMEPRAZOLE MAGNESIUM 40 MG PO CPDR
40 MG | ORAL_CAPSULE | ORAL | 3 refills | Status: DC
Start: 2017-08-03 — End: 2018-05-26

## 2017-12-07 ENCOUNTER — Encounter: Attending: Cardiovascular Disease | Primary: Family Medicine

## 2017-12-08 NOTE — Telephone Encounter (Signed)
left msg on pt phone in regards to appt reminder for 4-17

## 2017-12-09 ENCOUNTER — Ambulatory Visit
Admit: 2017-12-09 | Discharge: 2017-12-09 | Payer: MEDICARE | Attending: Clinical Nurse Specialist | Primary: Family Medicine

## 2017-12-09 DIAGNOSIS — I2510 Atherosclerotic heart disease of native coronary artery without angina pectoris: Secondary | ICD-10-CM

## 2017-12-09 MED ORDER — NITROGLYCERIN 0.4 MG SL SUBL
0.4 MG | ORAL_TABLET | SUBLINGUAL | 3 refills | Status: AC | PRN
Start: 2017-12-09 — End: ?

## 2017-12-09 MED ORDER — METOPROLOL TARTRATE 25 MG PO TABS
25 MG | ORAL_TABLET | ORAL | 5 refills | Status: AC
Start: 2017-12-09 — End: ?

## 2017-12-09 MED ORDER — CELECOXIB 200 MG PO CAPS
200 MG | ORAL_CAPSULE | Freq: Two times a day (BID) | ORAL | 1 refills | Status: AC
Start: 2017-12-09 — End: ?

## 2017-12-09 NOTE — Progress Notes (Signed)
[facility] [place], [place]  [address], [place]  [address]  Phone: [phone]  Fax: [phone]    OFFICE VISIT:  12/09/2017    Richard Sims - DOB: July 26, 1951    Reason For Visit:  Richard Sims is a 67 y.o. male who is here for 1 Year Follow Up (No cardiac symptoms today.) and Coronary Artery Disease    HPI  Patient is here for follow-up with a history of CAD, hypertension, hyperlipidemia. His last heart cath was in 2013 where he had a drug-eluting stent to the LAD. Prior to that he had a CABG x 1 in 2012. He reports this will be his last visit here as he is moving to [place] in Jan  Coronary Artery Disease   Presents for follow-up visit. Symptoms include palpitations (brief, infrequent). Pertinent negatives include no chest pain, chest pressure, chest tightness, dizziness, leg swelling, muscle weakness, shortness of breath or weight gain. The symptoms have been stable. Compliance with diet is good. Compliance with exercise is good. Compliance with medications is good.      bike  Keene Breath, MD is PCP.  Richard Sims has the following history as recorded in EpicCare:    Patient Active Problem List    Diagnosis Date Noted   . Lumbar disc disease with radiculopathy 08/11/2016   . Statin intolerance 05/26/2016   . Myalgia 05/26/2016   . Lumbar disc disease with radiculopathy 04/30/2016   . Lumbar disc disease with radiculopathy 11/15/2015   . Lumbar disc disease with radiculopathy 08/15/2015   . Lumbar disc disease with radiculopathy 05/16/2015   . H/O right coronary artery stent placement 05/09/2015   . Lumbar disc disease with radiculopathy 01/15/2015   . Low back pain radiating to left leg 09/11/2014   . Lumbar degenerative disc disease 08/11/2014   . Lumbar radiculopathy 08/11/2014   . Chest pain 12/09/2011   . Shortness of breath 12/09/2011   . S/P CABG x 1    . CAD (coronary artery disease)    . Hyperlipidemia    . Hypertension      Past Medical History:   Diagnosis  Date   . CAD (coronary artery disease)    . Cervical disc disease    . GERD (gastroesophageal reflux disease)    . Hyperlipidemia     [facility] manage his lipid/liver panel   . Hypertension    . Low back pain radiating to left leg 09/11/2014   . Lumbar degenerative disc disease 08/11/2014   . Lumbar radiculopathy 08/11/2014   . Myalgia 05/26/2016   . Nephrolithiasis    . S/P CABG x 1     MICS with LIMA to LAD via left mini thoracotomy   . Statin intolerance 05/26/2016     Past Surgical History:   Procedure Laterality Date   . CARDIAC CATHETERIZATION  05/29/11   [facility]    EF  over 60%   . CARDIAC CATHETERIZATION  12/12/2011  [facility]    with PTCA, stent to LAD   . CORONARY ARTERY BYPASS GRAFT  06/02/2011     HYBRID MICS CABG, LT THORACOTOMY APPROACH, LIMA-LAD, PCI DES-RCA   . JOINT REPLACEMENT      LEFT KNEE   . KNEE ARTHROSCOPY      BILATERAL   . SPINE SURGERY      ACF X 2     Family History   Problem Relation Age of Onset   . Heart Disease  Father    . High Blood Pressure Father    . High Cholesterol Father    . High Blood Pressure Mother    . Depression Paternal Grandmother      Social History     Tobacco Use   . Smoking status: Never Smoker   . Smokeless tobacco: Never Used   Substance Use Topics   . Alcohol use: Yes     Comment: rarely      Current Outpatient Medications   Medication Sig Dispense Refill   . nitroGLYCERIN (NITROSTAT) 0.4 MG SL tablet Place 1 tablet under the tongue every 5 minutes as needed for Chest pain up to 3 doses, in no relief report to the ER. 25 tablet 3   . metoprolol tartrate (LOPRESSOR) 25 MG tablet TAKE 1/2 TABLET TWICE DAILY 90 tablet 5   . celecoxib (CELEBREX) 200 MG capsule Take 1 capsule by mouth 2 times daily (3 month supply) 180 capsule 1   . esomeprazole (NEXIUM) 40 MG delayed release capsule TAKE 1 CAPSULE EVERY MORNING (BEFORE BREAKFAST) 90 capsule 3   . PRALUENT 75 MG/ML SOPN injection pen INJECT ONE PEN UNDER THE SKIN EVERY 2 WEEKS AS DIRECTED 2 pen 11   . esomeprazole  Magnesium (NEXIUM) 40 MG PACK Take 40 mg by mouth as needed     . RAPAFLO 8 MG CAPS 8 mg every evening      . dutasteride (AVODART) 0.5 MG capsule Take 0.5 mg by mouth daily.     Marland Kitchen aspirin 81 MG EC tablet Take 81 mg by mouth daily.         No current facility-administered medications for this visit.      Allergies: Crestor [rosuvastatin] and Simvastatin    Review of Systems  Review of Systems   Constitutional: Negative for chills, fever, malaise/fatigue and weight gain.   HENT: Negative for nosebleeds.    Eyes: Negative for blurred vision.   Respiratory: Negative for cough, chest tightness and shortness of breath.    Cardiovascular: Positive for palpitations (brief, infrequent). Negative for chest pain, orthopnea, leg swelling and PND.   Gastrointestinal: Negative for heartburn, nausea and vomiting.   Musculoskeletal: Negative for falls, myalgias and muscle weakness.   Skin: Negative for rash.   Neurological: Negative for dizziness, sensory change, speech change, focal weakness and headaches.   Endo/Heme/Allergies: Does not bruise/bleed easily.   Psychiatric/Behavioral: Negative for depression. The patient is not nervous/anxious.        Objective  Vital Signs - BP 136/82   Pulse 68   Ht 5\' 7"  (1.702 m)   Wt 182 lb (82.6 kg)   BMI 28.51 kg/m   Physical Exam   Constitutional: He is oriented to person, place, and time. He appears well-developed and well-nourished. No distress.   HENT:   Head: Normocephalic and atraumatic.   Eyes: Pupils are equal, round, and reactive to light. Right eye exhibits no discharge. Left eye exhibits no discharge.   Neck: No JVD present. No tracheal deviation present.   Cardiovascular: Normal rate, regular rhythm, normal heart sounds and intact distal pulses. Exam reveals no gallop and no friction rub.   No murmur heard.  No carotid bruit   Pulmonary/Chest: Effort normal and breath sounds normal. No respiratory distress. He has no wheezes. He has no rales.   Abdominal: Soft. There is no  tenderness.   Musculoskeletal: He exhibits no edema.   Normal gait and station   Neurological: He is alert and oriented to person,  place, and time. No cranial nerve deficit.   Skin: Skin is warm and dry. No rash noted.   Psychiatric: He has a normal mood and affect. His behavior is normal. Judgment normal.   Nursing note and vitals reviewed.      Assessment:     Diagnosis Orders   1. Coronary artery disease involving native coronary artery of native heart without angina pectoris     2. Mixed hyperlipidemia  Lipid Panel    ALT    AST    ALT    AST    Lipid Panel   3. Essential hypertension       Patient is taking medications as prescribed    CAD-stable without angina    Hypertension-well controlled current regimen    Hyperlipidemia/statin intolerance-doing well with Praluent. Check lipids fasting    Stable cardiovascular status. No evidence of overt heart failure, angina or dysrhythmia.     Plan    Followup With APRN as needed  Establish with cardiology in [place].  Check cholesterol fasting    Call with any questions or concerns  Follow up with Keene Breath, MD for non cardiac problems  Report any new problems  Cardiovascular Fitness-Exercise as tolerated.  Strive for 15 minutes of exercise most days of the week.    Cardiac / Healthy Diet  Continue current medications as directed  Continue plan of treatment  It is always recommended that you bring your medications bottles with you to each visit - this is for your safety!       Renaldo Fiddler, APRN

## 2018-05-11 MED ORDER — ALIROCUMAB 75 MG/ML SC SOPN
75 MG/ML | PEN_INJECTOR | SUBCUTANEOUS | 11 refills | Status: AC
Start: 2018-05-11 — End: ?

## 2018-05-27 MED ORDER — ESOMEPRAZOLE MAGNESIUM 40 MG PO CPDR
40 MG | ORAL_CAPSULE | ORAL | 3 refills | Status: AC
Start: 2018-05-27 — End: ?

## 2018-06-15 MED ORDER — PRALUENT 75 MG/ML SC SOPN
75 MG/ML | PEN_INJECTOR | SUBCUTANEOUS | 5 refills | Status: AC
Start: 2018-06-15 — End: ?

## 2018-09-24 MED ORDER — EVOLOCUMAB 140 MG/ML SC SOAJ
140 MG/ML | PEN_INJECTOR | SUBCUTANEOUS | 11 refills | Status: AC
Start: 2018-09-24 — End: ?

## 2019-04-07 IMAGING — CT CTA CHEST
3 series · 8 of 16 positions shown, 15 images · IV contrast (APPLIED)
Comparison: There are no previous exams available for comparison.

CTA CHEST, 04/07/2019 [DATE]: 
CLINICAL INDICATION: Short of breath. Possible pulmonary emboli. MRI September 2018. Stent placement. 
A search for DICOM formatted images was conducted for prior CT imaging studies 
completed at a non-affiliated media free facility.
TECHNIQUE: The chest was scanned from base of neck through the lung bases with 
100 cc's of Isovue 370 injected intravenously on a high resolution low dose CT 
scanner using dose reduction techniques.  Routine MPR and MIP 3D renderings were 
reconstructed on an independent workstation with concurrent physician 
supervision.

[Series 4: pe chest 2.0 i31s 3 · axial · 0.92mm/px · z∈[-330,-160]mm · 4 of 143 slices shown, 9 images]
[im 29/143  soft-tissue]
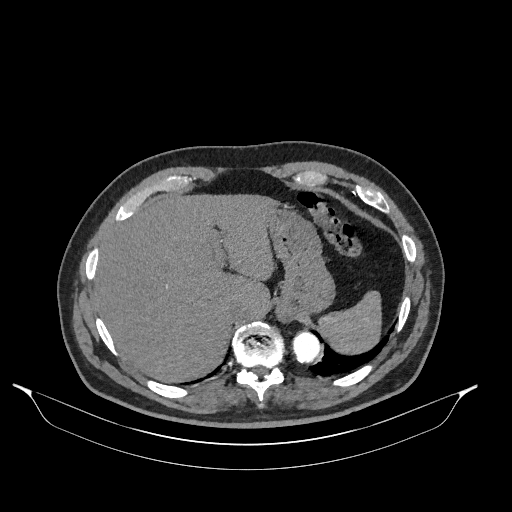
[im 29/143  lung]
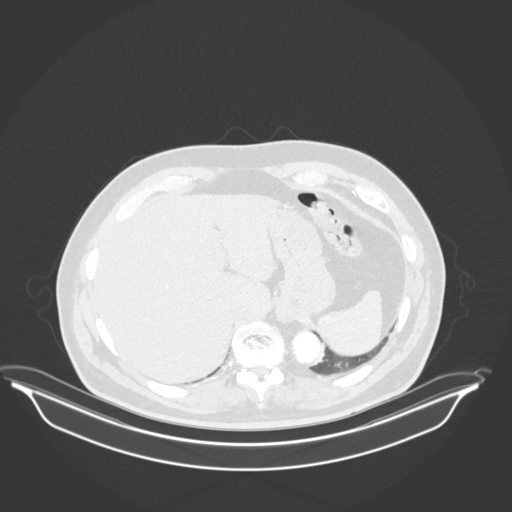
[im 29/143  bone]
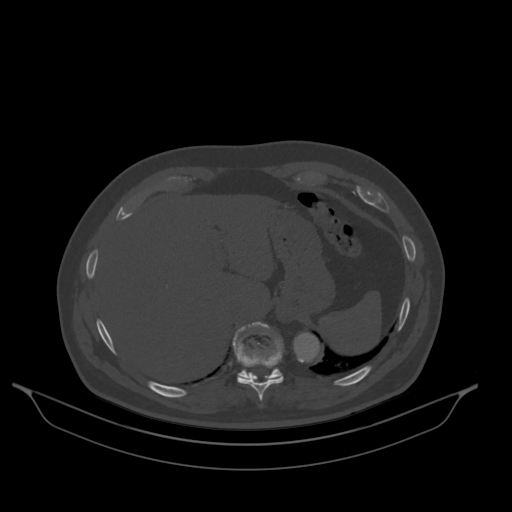
[im 57/143  soft-tissue]
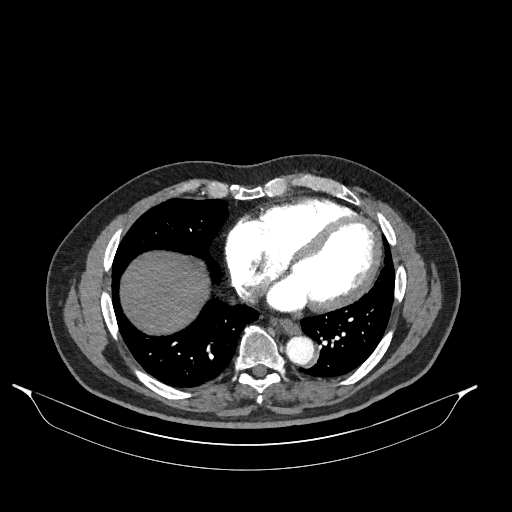
[im 57/143  lung]
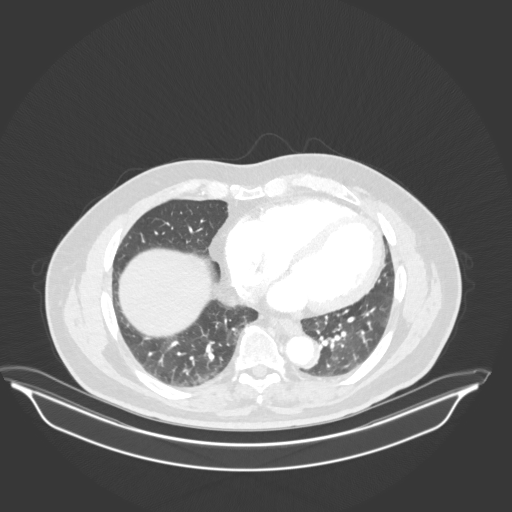
[im 86/143  soft-tissue]
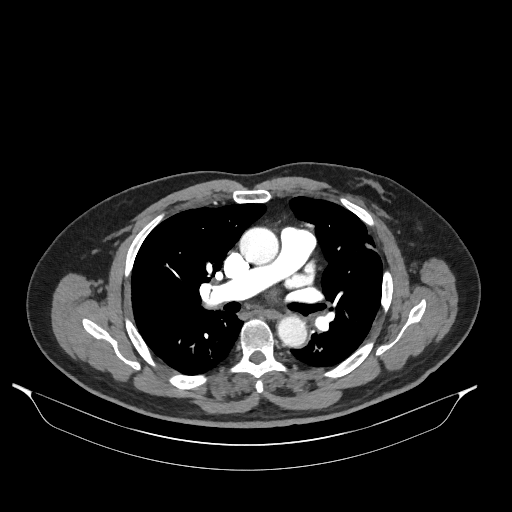
[im 86/143  lung]
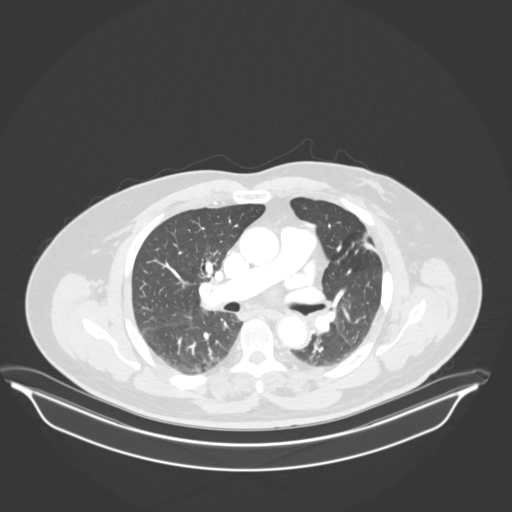
[im 114/143  soft-tissue]
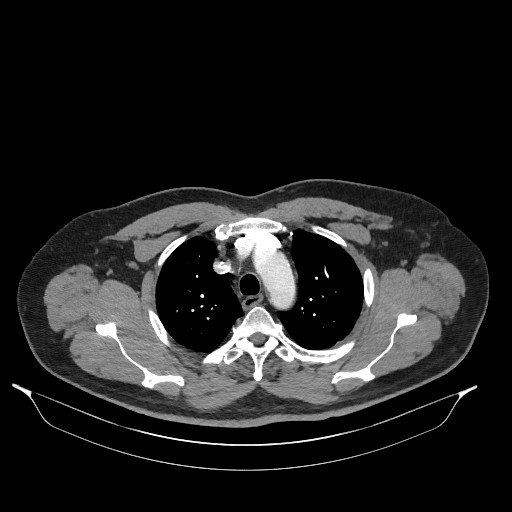
[im 114/143  lung]
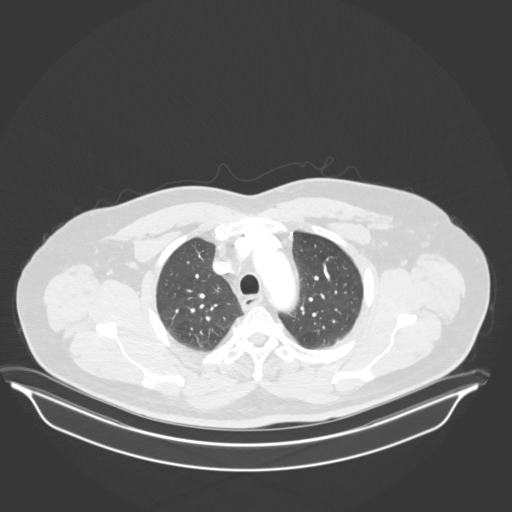

[Series 6: coronal · coronal · 0.58mm/px · 3 of 127 slices shown, 4 images]
[im 32/127  soft-tissue]
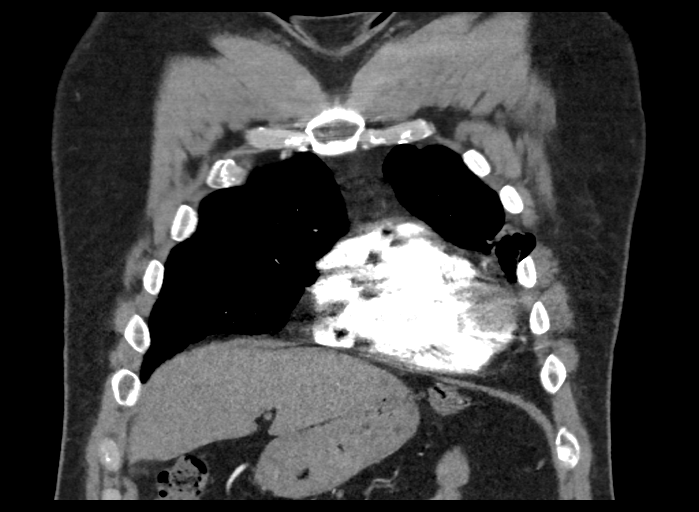
[im 64/127  soft-tissue]
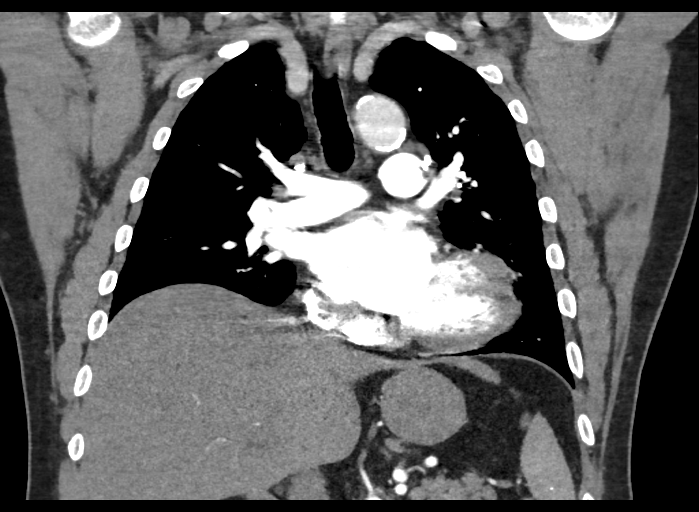
[im 64/127  bone]
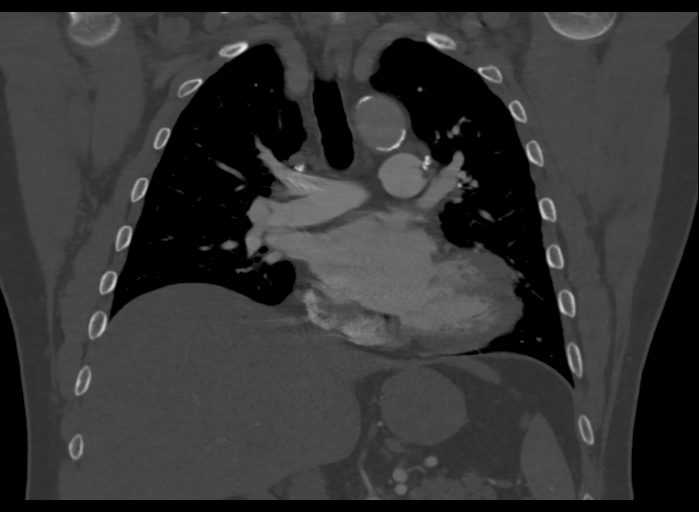
[im 95/127  soft-tissue]
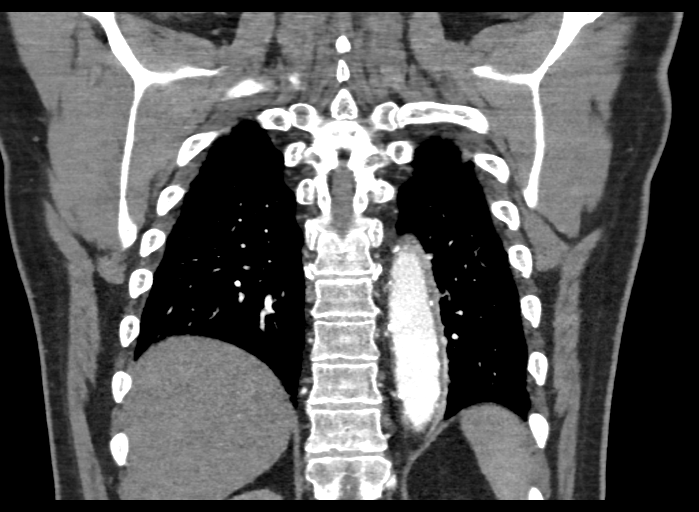

[Series 7: sagittal · sagittal · 0.59mm/px · 1 of 134 slices shown, 2 images]
[im 67/134  soft-tissue]
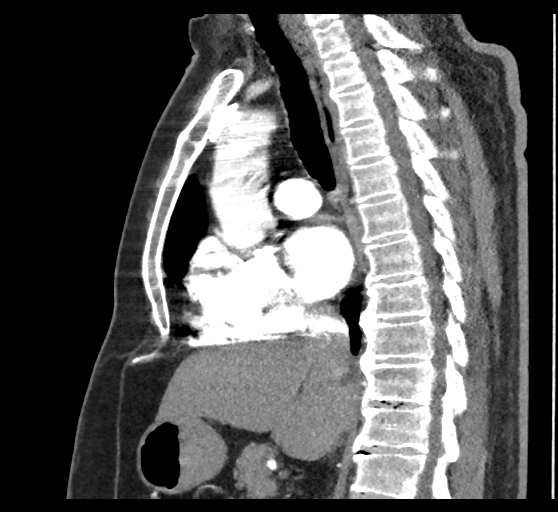
[im 67/134  bone]
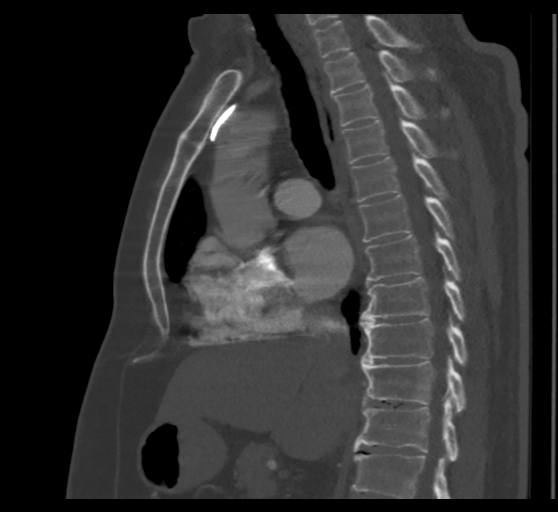

[8 of 16 positions shown; findings below may reference images not displayed]

FINDINGS: Borderline cardiomegaly. No pulmonary emboli. Coronary artery 
calcifications with apparent stent in the circumflex coronary artery. Probable 
scarring in the lingula. The lungs are moderately well expanded and clear. No 
pleural effusions. 
Moderate hiatal hernia. Gallbladder is contracted. Liver, spleen, adrenals and 
pancreas are normal. Tortuous aorta with no aneurysm.
IMPRESSION: Borderline cardiomegaly. 
Apparent stent in the circumflex coronary artery. Moderate coronary 
calcifications. 
 No pulmonary emboli. 
Probable scarring in the lingula. No prior studies. 
No acute findings. 
RADIATION DOSE REDUCTION: All CT scans are performed using radiation dose 
reduction techniques, when applicable.  Technical factors are evaluated and 
adjusted to ensure appropriate moderation of exposure.  Automated dose 
management technology is applied to adjust the radiation doses to minimize 
exposure while achieving diagnostic quality images.

## 2019-11-29 IMAGING — MR MRI LUMBAR SPINE WITHOUT CONTRAST
4 of 7 series · 30 of 48 positions shown · IV contrast (gadolinium)
Comparison: None prior of the lumbar spine.

MRI LUMBAR SPINE WITHOUT CONTRAST, 11/29/2019 [DATE]: 
CLINICAL INDICATION: Lower back pain. Pain radiates to both legs when doing 
activities. Right lumbar radiculopathy.
TECHNIQUE: Multiplanar, multiecho position MR images of the lumbar spine were 
performed without intravenous gadolinium enhancement.

[Series 101: survey · axial · 10.0mm · 1.39mm/px · z∈[-15,+199]mm · 3 of 14 slices shown]
[im 1/14]
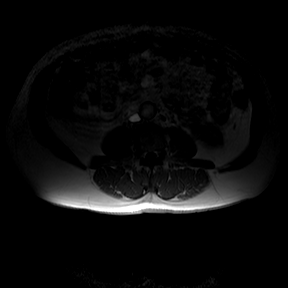
[im 7/14]
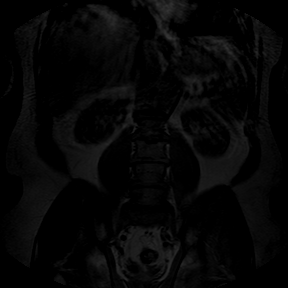
[im 14/14]
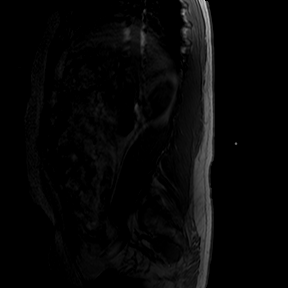

[Series 601: T2 · axial · 4.0mm · 0.47mm/px · z∈[-118,+95]mm · 9 of 30 slices shown (1 of 2)]
[im 1/30]
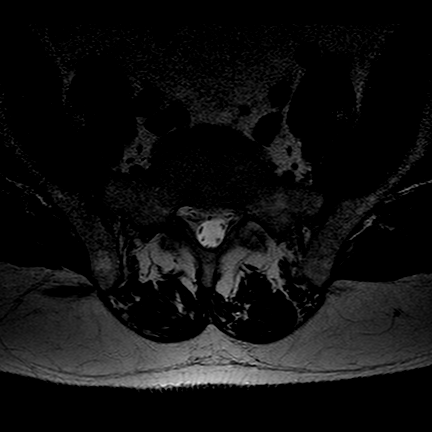
[im 4/30]
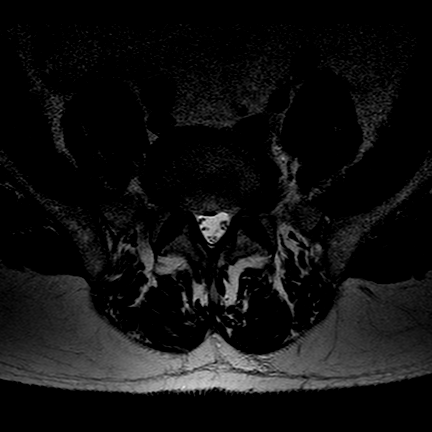
[im 8/30]
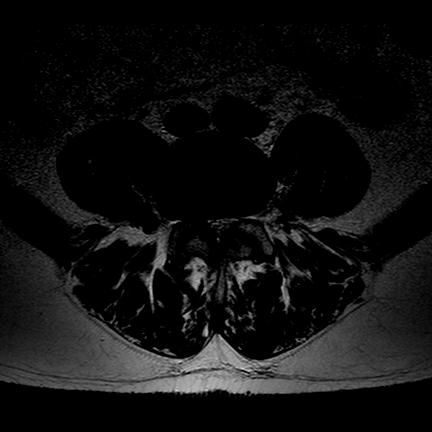
[im 11/30]
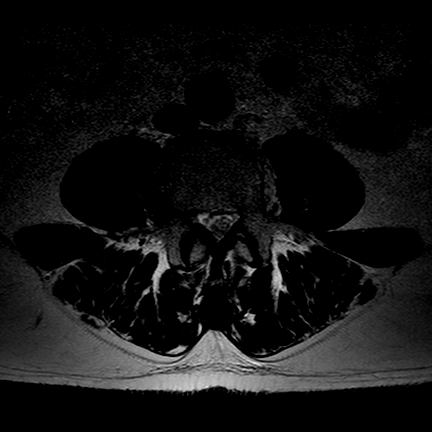
[im 15/30]
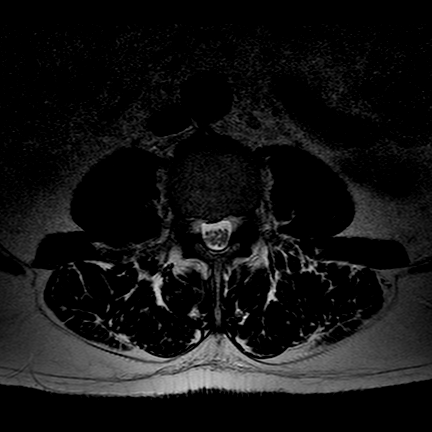
[im 19/30]
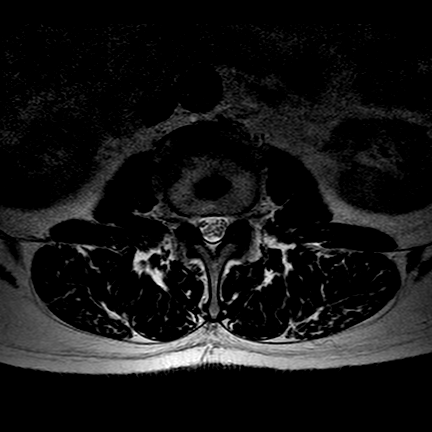
[im 22/30]
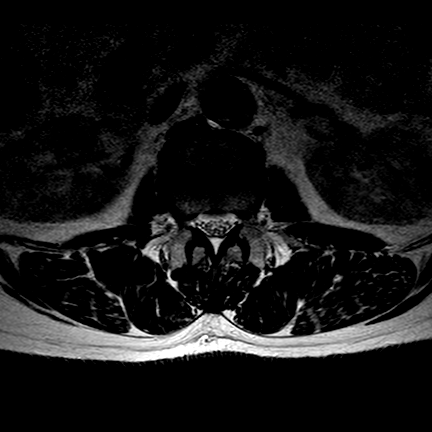
[im 26/30]
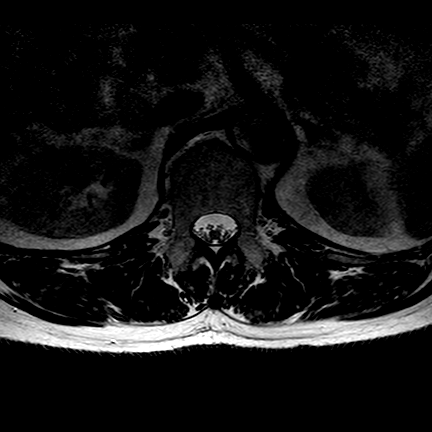
[im 30/30]
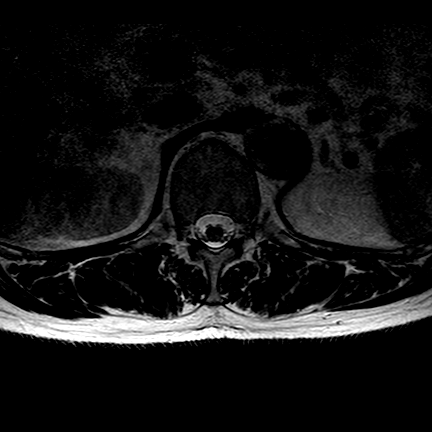

[Series 701: T1 · axial · 4.0mm · 0.35mm/px · z∈[-120,+50]mm · 7 of 35 slices shown]
[im 1/35]
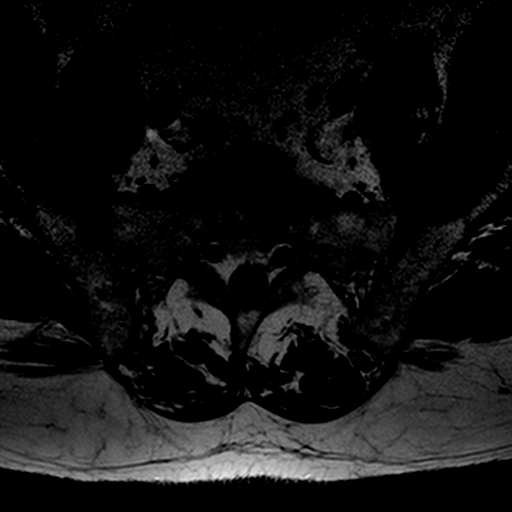
[im 4/35]
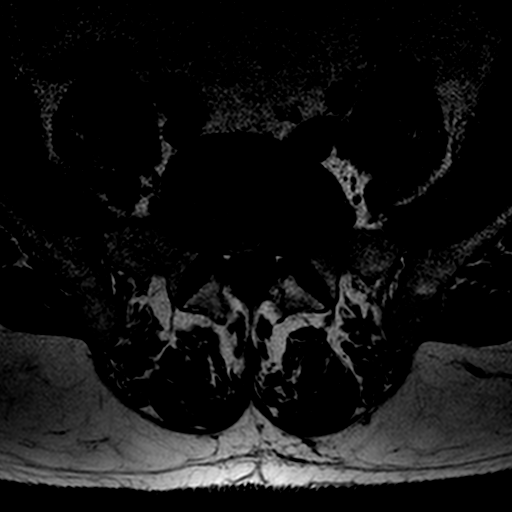
[im 12/35]
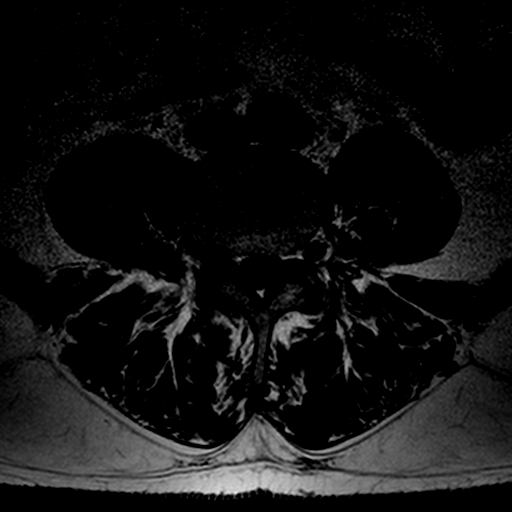
[im 16/35]
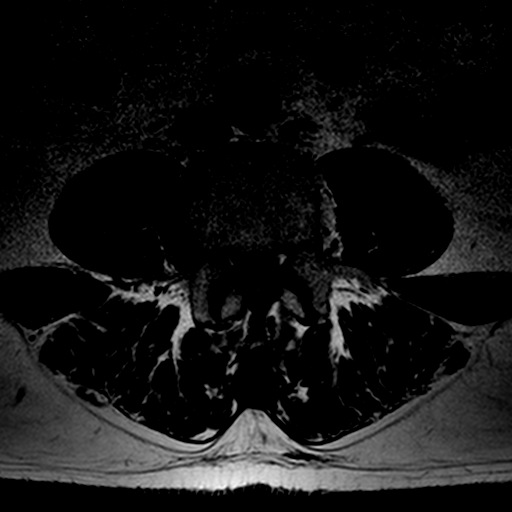
[im 19/35]
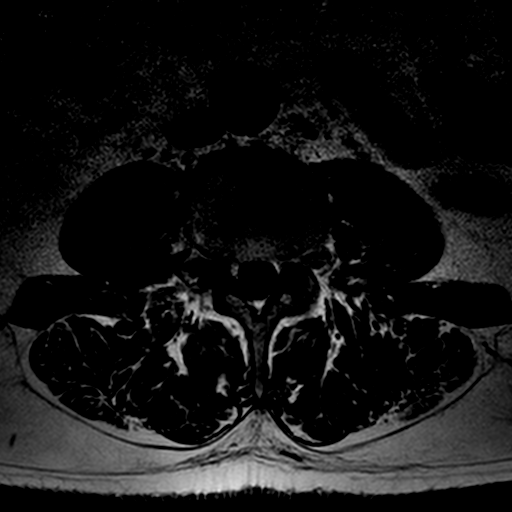
[im 23/35]
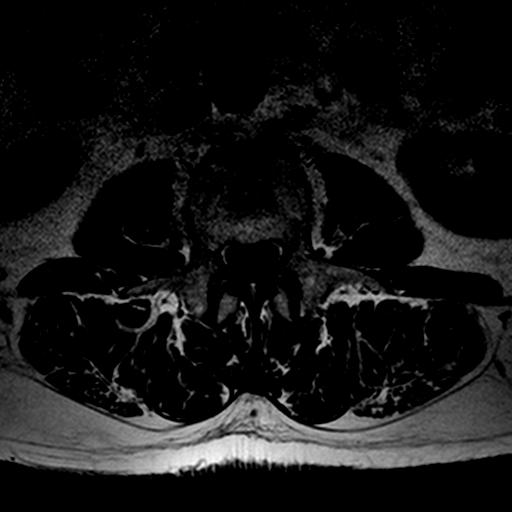
[im 31/35]
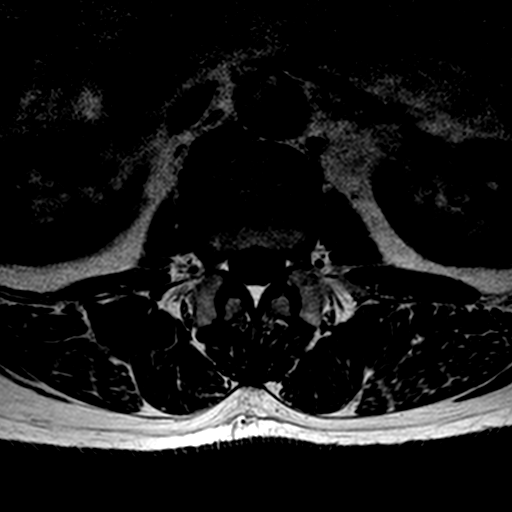

[Series 801: T2 · axial · 4.0mm · 0.67mm/px · z∈[-104,+81]mm · 11 of 38 slices shown (2 of 2)]
[im 1/38]
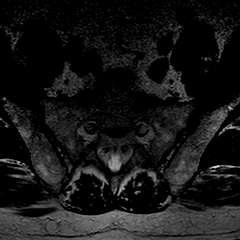
[im 4/38]
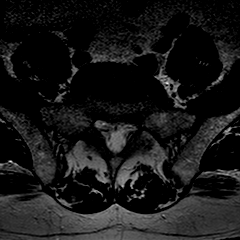
[im 8/38]
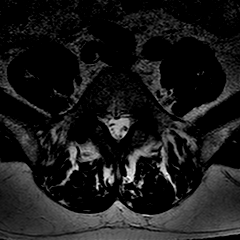
[im 12/38]
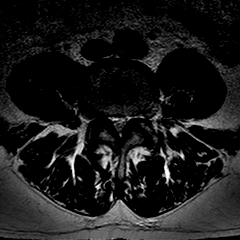
[im 15/38]
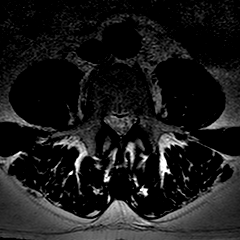
[im 19/38]
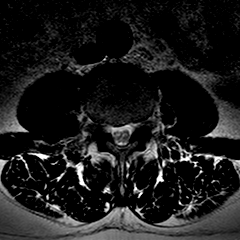
[im 23/38]
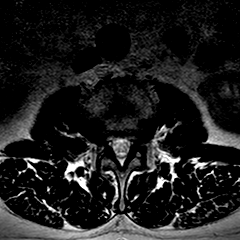
[im 26/38]
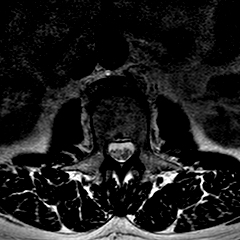
[im 30/38]
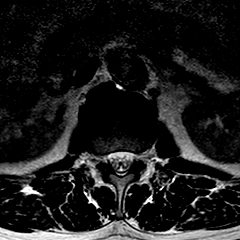
[im 34/38]
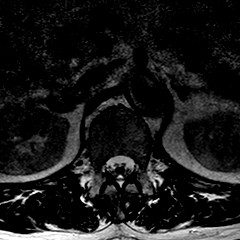
[im 38/38]
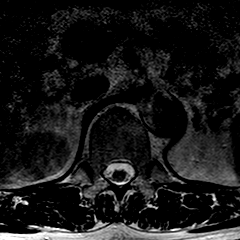

[30 of 48 positions shown; findings below may reference images not displayed]

FINDINGS: Nomenclature is based on 5 lumbar type vertebral bodies. There are 
overall short pedicle lengths contributing to overall congenital spinal canal 
stenosis. Slight retrolisthesis L2 on L3, L3 on L4 and L5 on S1. There are 
Schmorl's nodes at the L2-L3 level. Trace Modic type I discogenic marrow signal 
endplate changes along the anterior superior endplate of T12 and inferior 
endplate of L1.The conus tip terminates at the L1 vertebral body level.The aorta 
is normal in diameter. The posterior paraspinal soft tissues are negative. 
Included on the sagittal series, there is disc desiccation, dorsal disc height 
loss and mild broad-based is protrusion with partial effacement of the ventral 
thecal sac at T11-T12. 
T12-L1: Mild disc desiccation without disc height loss. No disc herniation. 
Normal facets. No spinal canal or neural foraminal stenosis. 
L1-L2: Moderate disc desiccation and mild disc height loss. Moderate broad-based 
disc protrusion mild facet and ligamentum flavum hypertrophy. Mild spinal canal 
and bilateral neural foraminal stenoses. 
L2-L3: Moderate disc desiccation and disc height loss. Mild retrolisthesis L2 on 
L3. Moderate facet and ligamentum flavum hypertrophy. Mild spinal canal and 
bilateral neural foraminal stenoses. 
L3-L4: Moderate disc desiccation and dorsal disc height loss. Moderate 
broad-based disc protrusion eccentric to the far right lateral aspect. There is 
disc abutment of the bilateral exiting L3 nerve roots. Mild retrolisthesis L3 on 
L4. Mild to moderate facet and ligamentum flavum hypertrophy. There is moderate 
to severe circumferential spinal canal stenosis with the spinal canal narrowed 
to 5 mm AP by 7 mm TR. There is effacement of CSF about the traversing cauda 
equina with buckling of the cauda equina nerve roots. 
L4-L5: Moderate disc desiccation without disc height loss. Mild annular bulge. 
Moderate facet hypertrophy. Moderate to severe right and moderate left neural 
foraminal stenosis. Moderate spinal calcinosis with the thecal sac narrowed to 6 
mm AP. Moderate to severe right neural foraminal stenosis and moderate left 
neural foraminal stenosis. 
L5-S1: Moderate disc desiccation and dorsal disc height loss. Mild 
retrolisthesis L5 on S1. Mild dorsal disc osteophyte complex with abutment of 
the exiting bilateral L5 nerve roots. Moderate bilateral neural foraminal 
stenoses.
IMPRESSION: 1.  Moderately advanced spondylotic changes lumbar spine superimposed upon a 
congenital narrowed spinal canal. 
2.  Multilevel spinal canal stenosis greatest at L3-L4. 
3.  Multilevel neural foraminal stenoses with potential for multilevel neural 
impingement.

## 2020-07-25 IMAGING — DX SHOULDER RIGHT 2 VIEWS
1 series · 3 of 3 positions shown · non-contrast
Comparison: None

SHOULDER RIGHT 2 VIEWS, 07/25/2020 [DATE]: 
CLINICAL INDICATION: Fell onto shoulder 3 weeks ago with persistent pain and 
decreased range of motion

[Series 1: AP · 0.14mm/px · 3 of 3 slices shown]
[im 1/3]
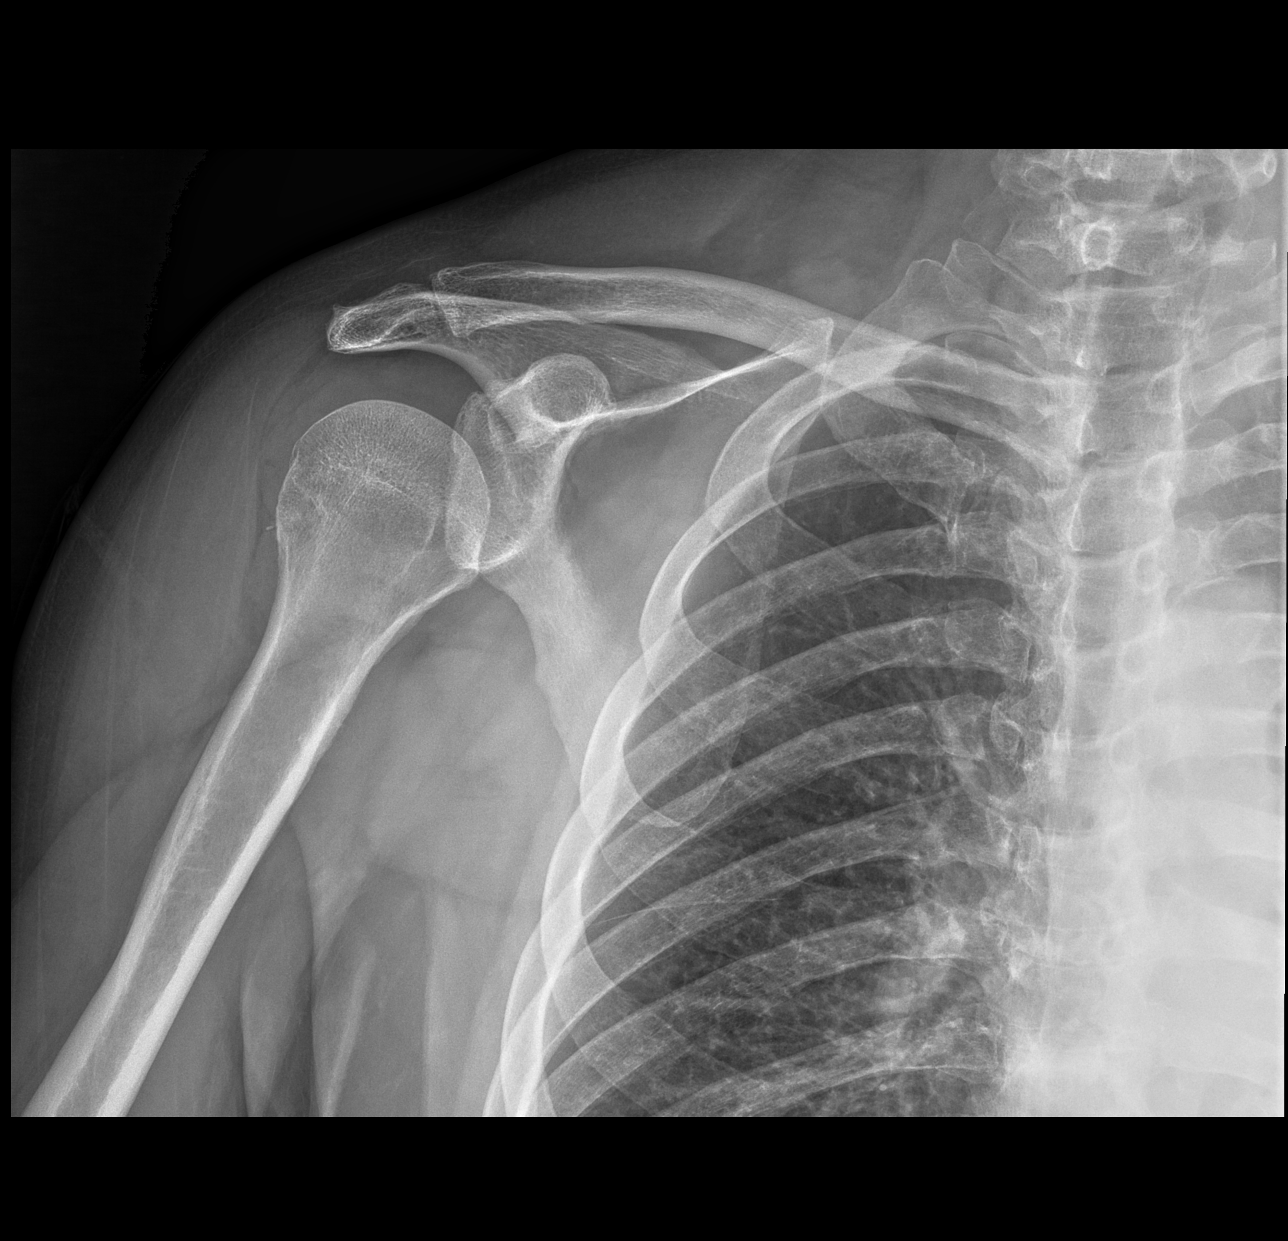
[im 2/3]
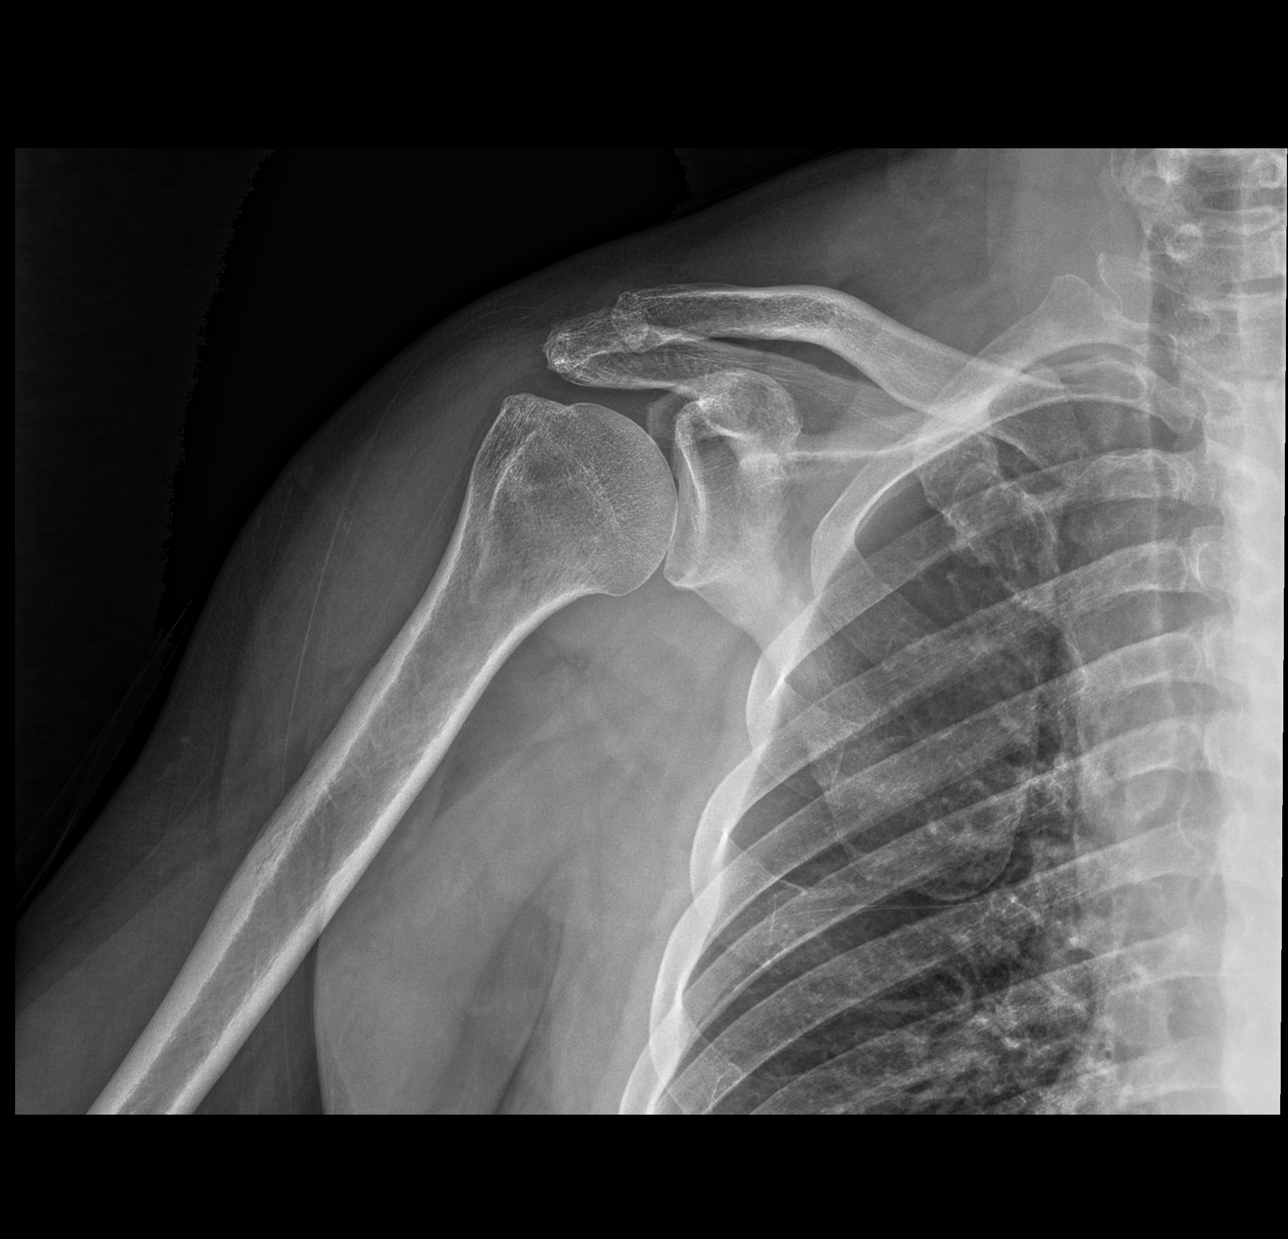
[im 3/3]
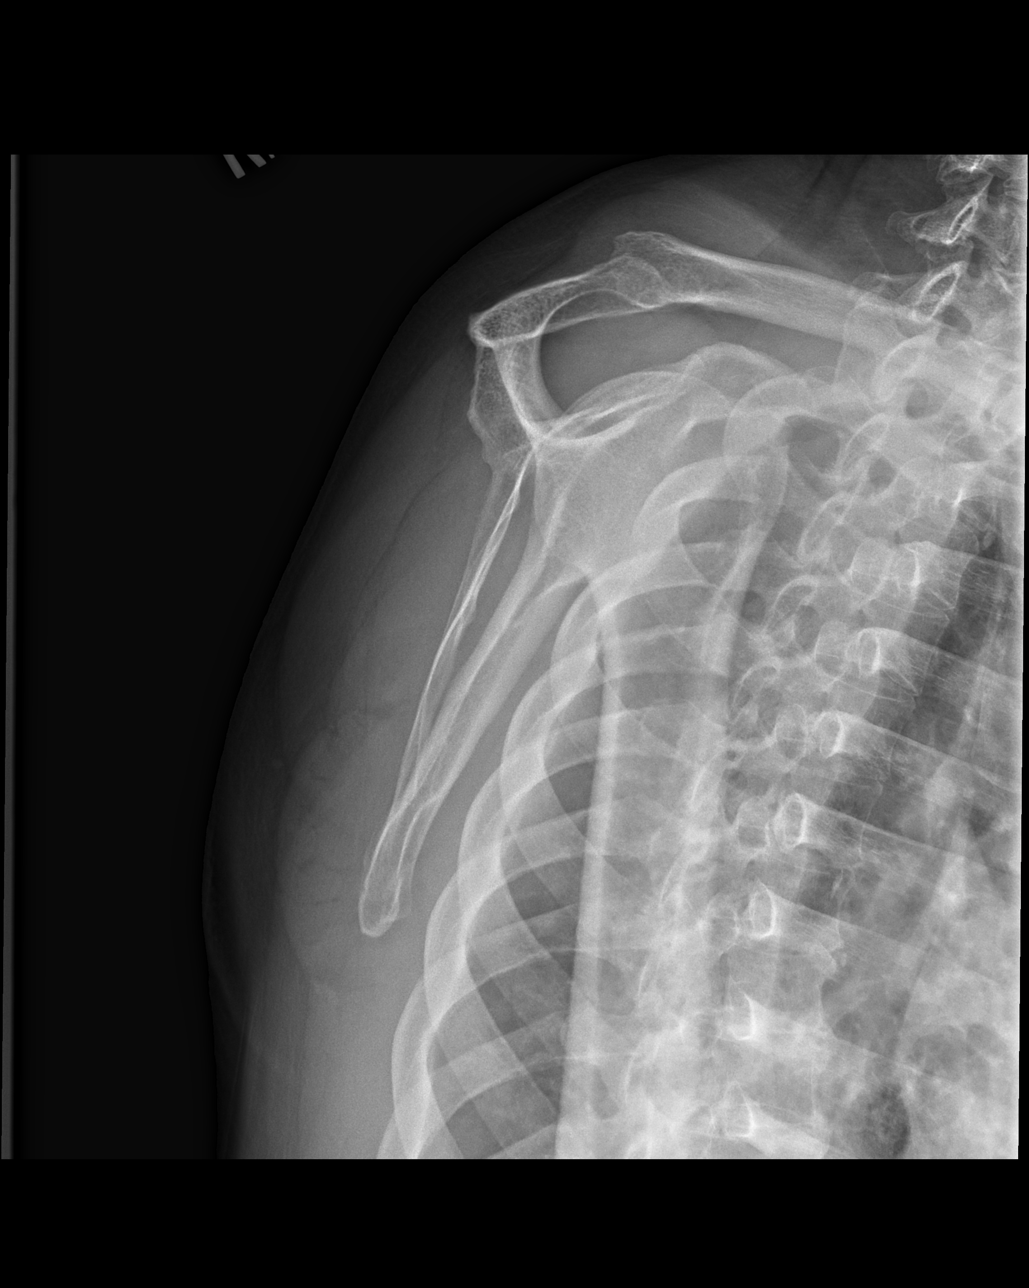

[3 of 3 positions shown; findings below may reference images not displayed]

FINDINGS: No evidence for fracture or shoulder dislocation. No osseous lesions. The 
acromioclavicular joint appears intact. The adjacent ribs are also unremarkable.
IMPRESSION: No evidence for fracture or dislocation. No significant degenerative changes.

## 2020-12-17 IMAGING — MR MRI LUMBAR SPINE W/WO CONTRAST
4 of 9 series · 22 of 48 positions shown · IV contrast (gadavist)
Comparison: Lumbar MRI November 29, 2019

MRI LUMBAR SPINE W/WO CONTRAST, 12/17/2020 [DATE]: 
CLINICAL INDICATION: Radiculopathy
TECHNIQUE: Sagittal T1, Sagittal T2, Sagittal STIR, Axial T2, Axial T1, Enhanced 
sagittal T1, Enhanced fat suppressed sagittal T1, and Enhanced axial T1 MR 
images of the lumbar spine were performed with and without intravenous 
enhancement.  8 cc's of Gadavist is injected intravenously by hand. The 
patient's eGFR was calculated to be 66 using the i-STAT device.

[Series 101: survey · axial · 10.0mm · 1.39mm/px · z∈[-15,+199]mm · 3 of 14 slices shown]
[im 1/14]
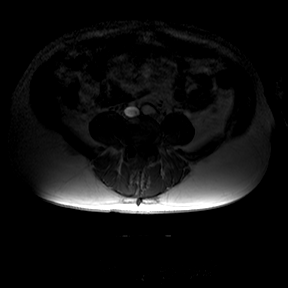
[im 9/14]
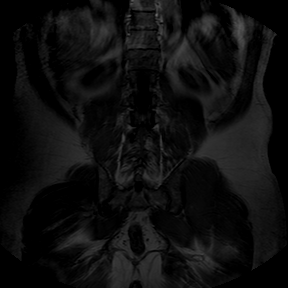
[im 14/14]
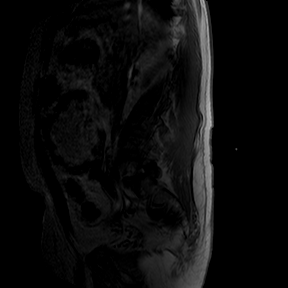

[Series 601: T2 · axial · 4.0mm · 0.47mm/px · z∈[-84,+135]mm · 7 of 30 slices shown]
[im 1/30]
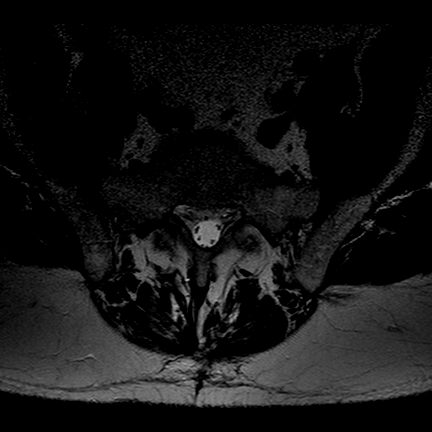
[im 5/30]
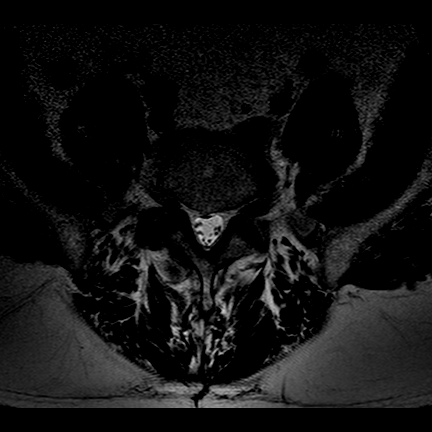
[im 10/30]
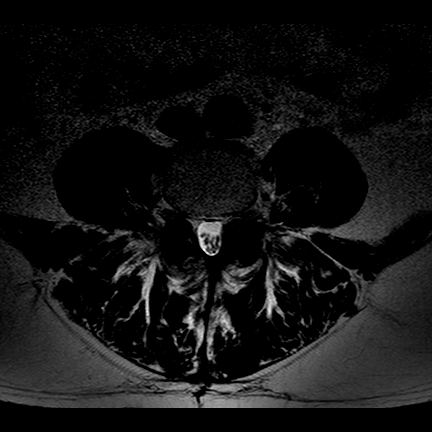
[im 15/30]
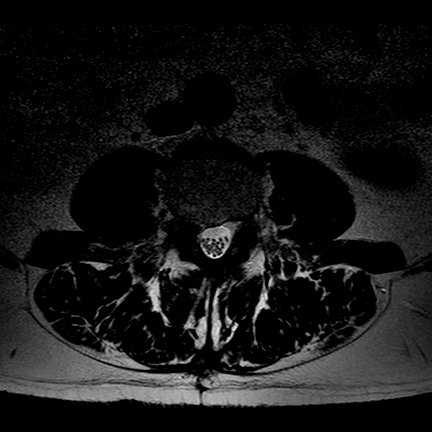
[im 20/30]
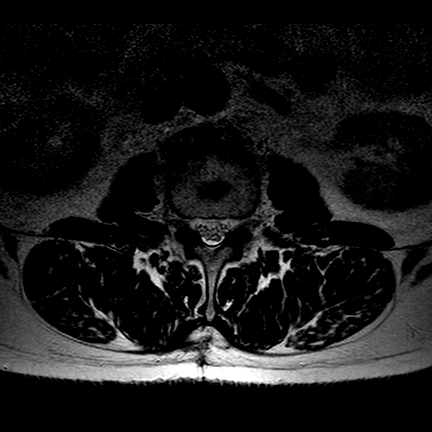
[im 25/30]
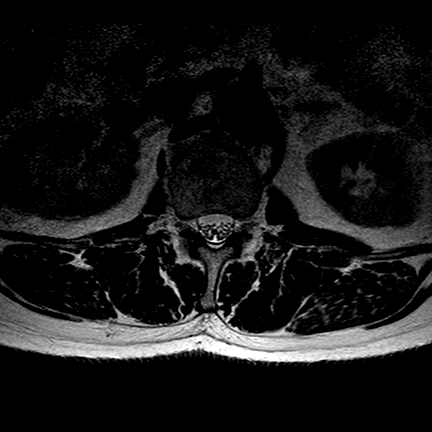
[im 30/30]
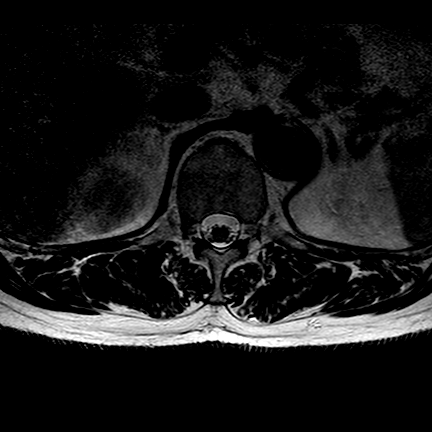

[Series 701: T1 · axial · 4.0mm · 0.35mm/px · z∈[-84,+105]mm · 8 of 35 slices shown (1 of 2)]
[im 1/35]
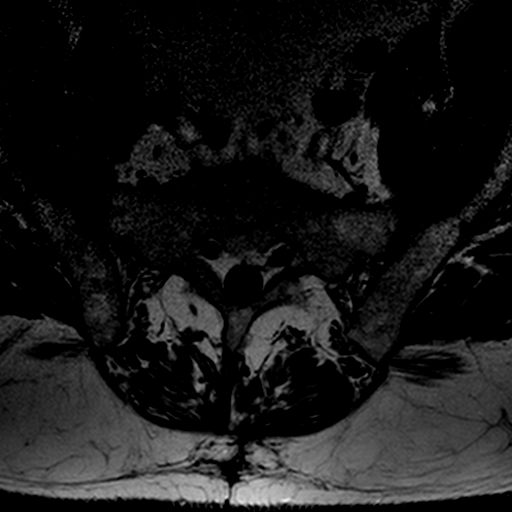
[im 5/35]
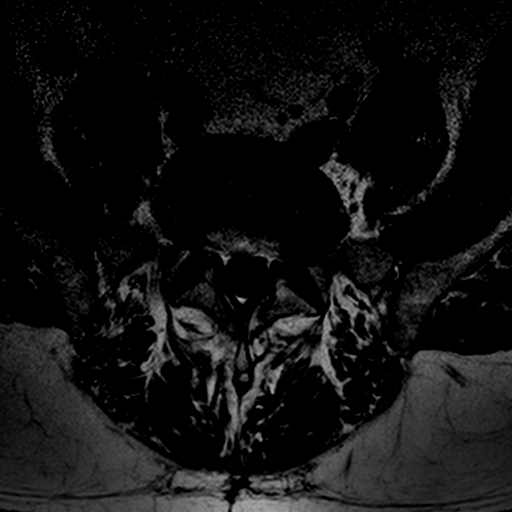
[im 10/35]
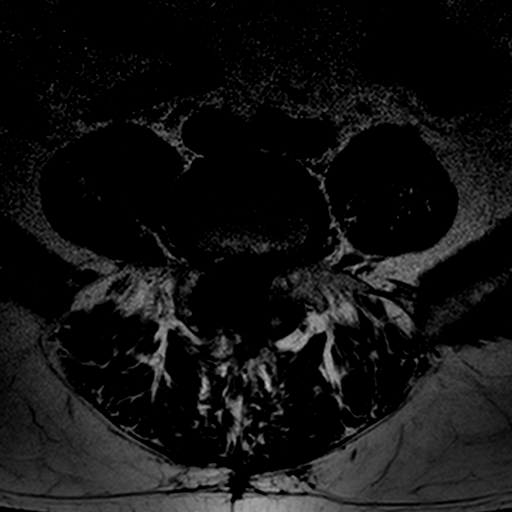
[im 15/35]
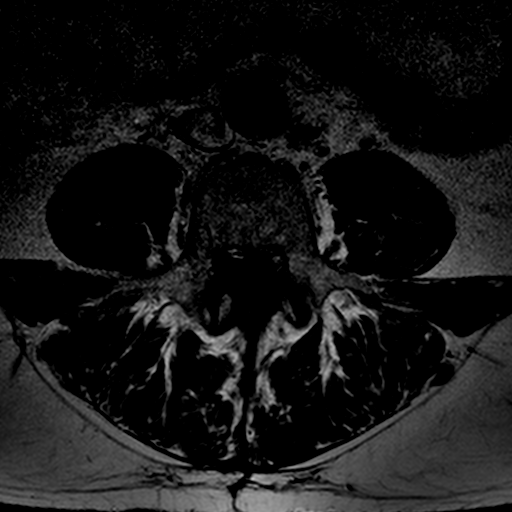
[im 20/35]
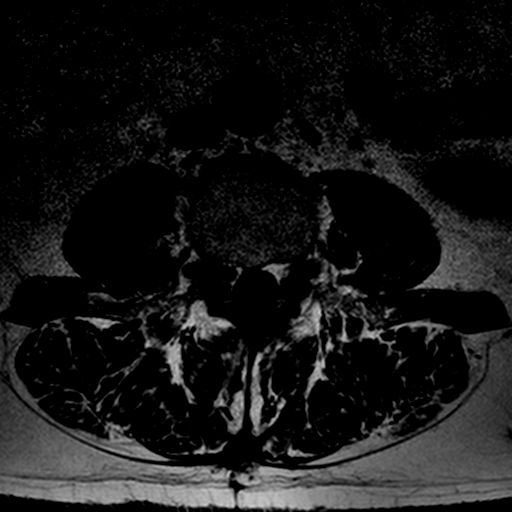
[im 25/35]
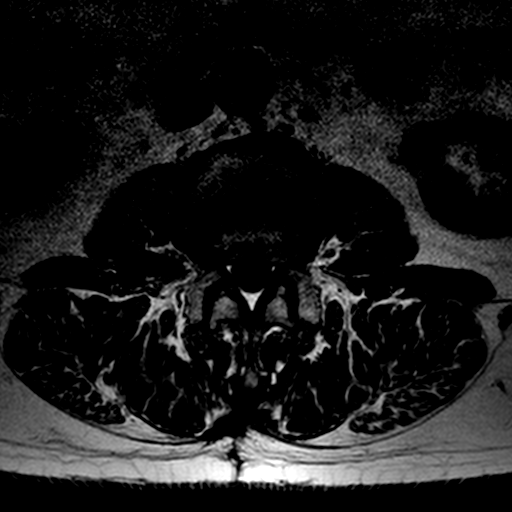
[im 30/35]
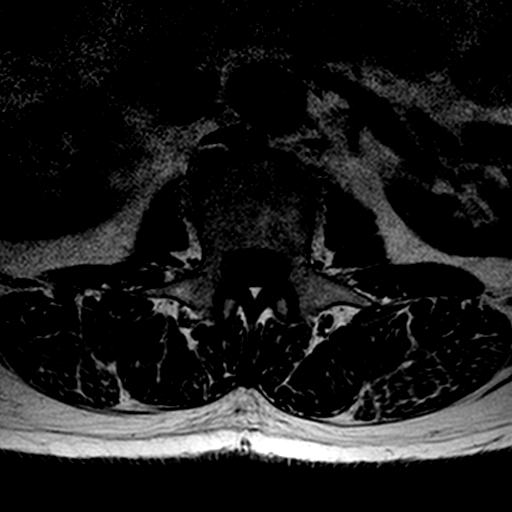
[im 35/35]
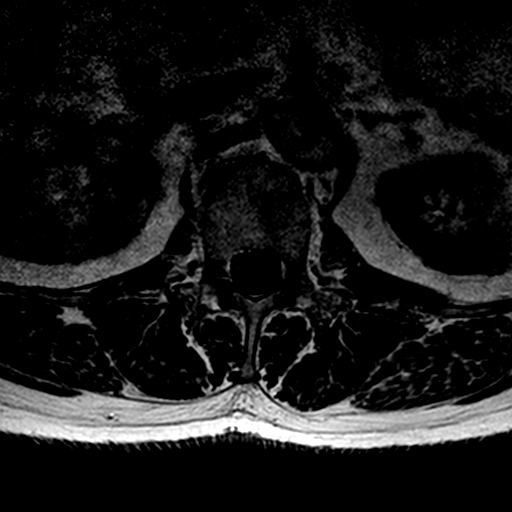

[Series 1001: T1 · axial · 4.0mm · 0.35mm/px · z∈[-84,+83]mm · 4 of 35 slices shown (2 of 2)]
[im 1/35]
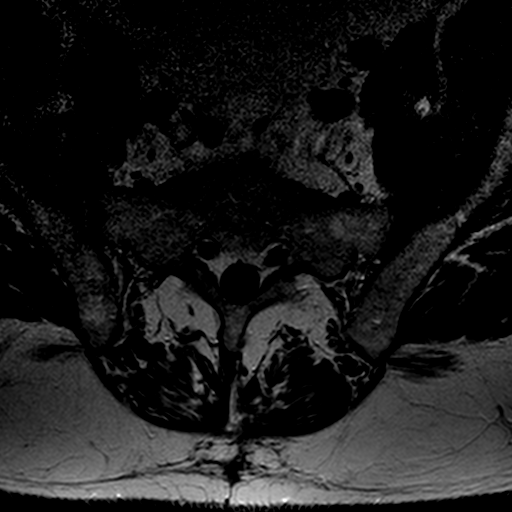
[im 5/35]
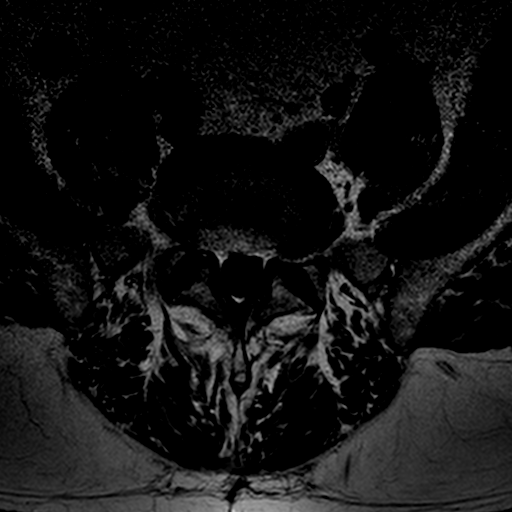
[im 20/35]
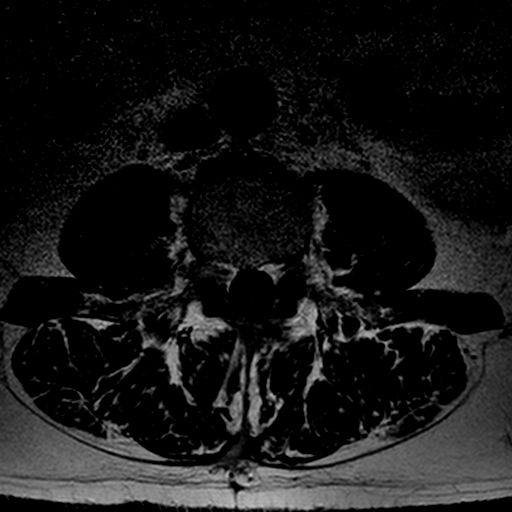
[im 30/35]
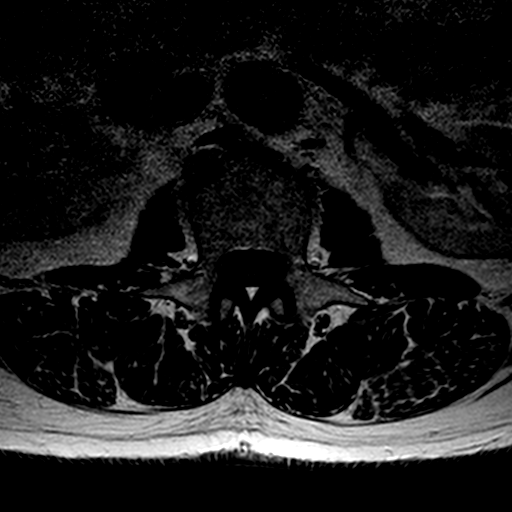

[22 of 48 positions shown; findings below may reference images not displayed]

FINDINGS: Lumbar vertebral heights are intact. There is marked disc narrowing at 
L2-3 with chronic Schmorl's nodes. Moderate narrowing on the posterior L3-4 and 
L5-S1 interspaces. There is less than grade 1 retrolisthesis at L3-4 and L5-S1, 
slight retrolisthesis at L2-3. 
The conus is normal. No evidence for compression fracture or malignancy. 
At L5-S1 there is narrowing of the upper S1 lateral recesses. There is no 
significant canal stenosis. There is moderate foraminal stenosis. 
At L4-5 there has been decompression. Broad-based disc bulge encroaches on the 
L5 lateral recesses bilaterally. There is moderate to marked bilateral foraminal 
stenosis impinging the L4 nerve roots. 
At L3-4 there has been decompression. Based disc bulge and retrolisthesis 
produce mild encroachment on the upper L4 lateral recesses. There is moderate to 
marked right foraminal stenosis impinging the distal right L3 nerve root. 
At L2-3 there is mild canal stenosis. Foramina are open. 
At L1-2 mild disc bulge contributes to borderline-mild canal stenosis. Foramina 
are open. 
Enhanced images show mild enhancement along the right lateral L3-4 endplate. 
There is mild increased T2 signal seen on STIR images at this level, consistent 
with degenerative reactive change. There is no pathologic intrathecal 
enhancement. No findings to indicate malignancy. There is postoperative 
hyperemia at the laminectomy sites.
IMPRESSION: Postsurgical changes as described. There has been decompression of the 
previously seen canal stenosis at L3-4 and L4-5. There is no residual canal 
stenosis at these levels. There is residual encroachment on the L5 lateral 
recesses at L4-5, mild encroachment on the L4 lateral recesses at the L3-4 
level. There is slight retrolisthesis at L3-4 and L5-S1, unchanged. 
There is mild Modic type I change along the right L3-4 interspace, and mild 
reactive endplate enhancement. Endplate edema has increased compared to the 
prior MRI. 
There is mild canal stenosis at L2-3, not different. There is foraminal stenosis 
bilaterally at L4-5, on the right at L3-4. 
Chronic Schmorl's nodes at L2-3. There is no evidence for compression fracture.

## 2022-02-18 IMAGING — MR MRI RIGHT SHOULDER WITHOUT CONTRAST
4 of 7 series · 18 of 40 positions shown · IV contrast (gadolinium)
Comparison: 07/25/2020 radiographs and 04/07/2019 CTA CHEST

________________________________________________________________________________________________ 
MRI RIGHT SHOULDER WITHOUT CONTRAST, 02/18/2022 [DATE]: 
CLINICAL INDICATION: Localized Swelling, Mass And Lump, Right Upper Limb.
TECHNIQUE: Multiplanar, multiecho position MR images of the shoulder were 
performed without intravenous gadolinium enhancement. Patient was scanned on a 
1.5T magnet.

[Series 201: survey right · axial · right · 10.0mm · 0.71mm/px · z∈[-40,+125]mm · 3 of 15 slices shown]
[im 1/15]
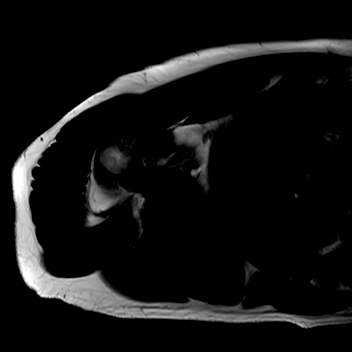
[im 8/15]
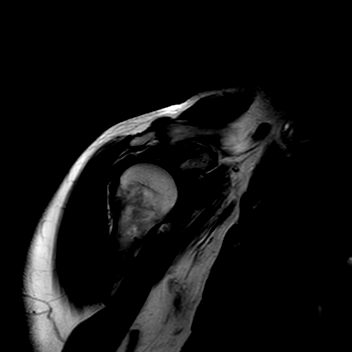
[im 15/15]
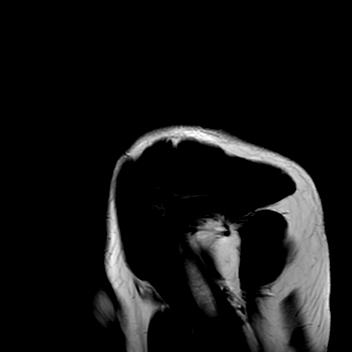

[Series 301: (person_name)_(person_name)_(person_name) right · axial · right · 3.0mm · 0.35mm/px · z∈[-36,+53]mm · 6 of 28 slices shown]
[im 1/28]
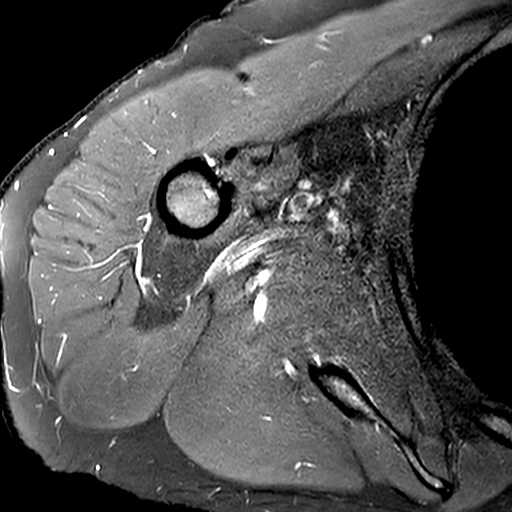
[im 6/28]
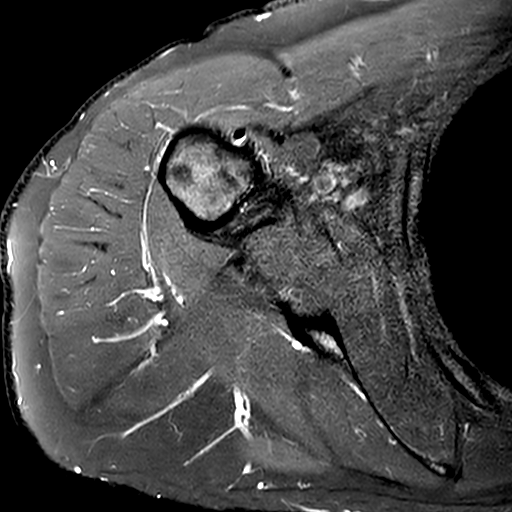
[im 11/28]
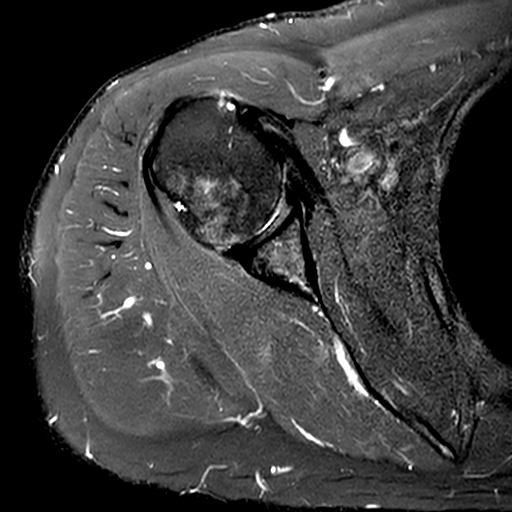
[im 17/28]
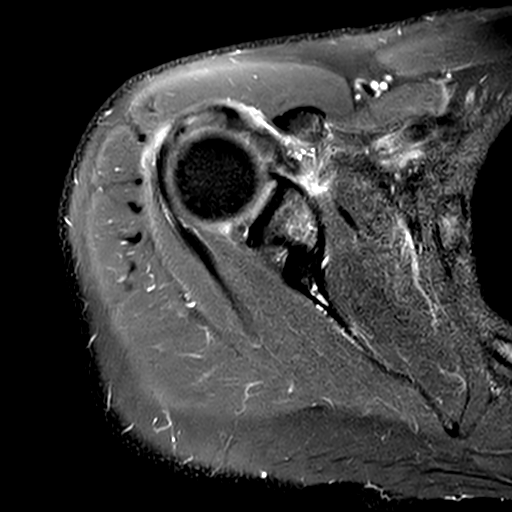
[im 22/28]
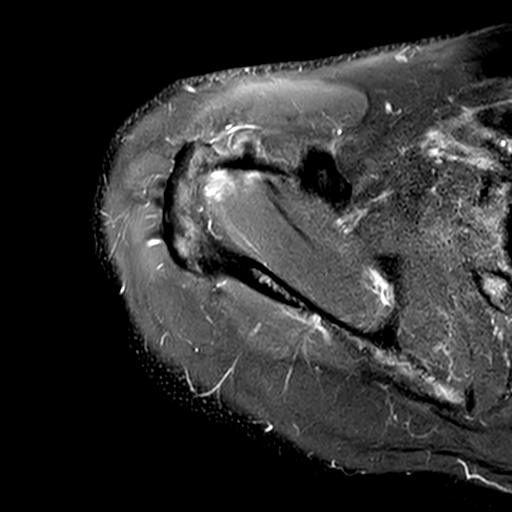
[im 28/28]
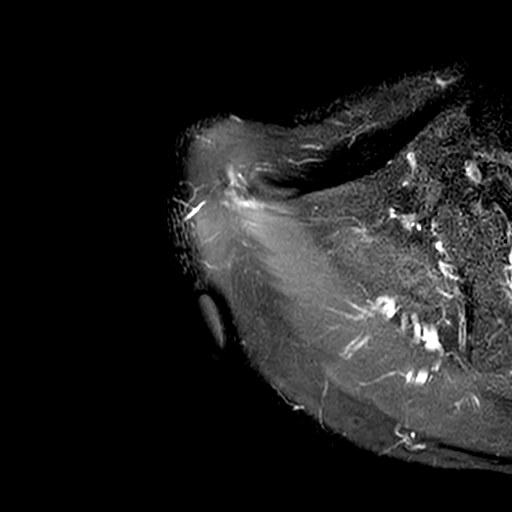

[Series 401: t2_fs_sag right · oblique · right · 3.0mm · 0.34mm/px · 6 of 30 slices shown]
[im 1/30]
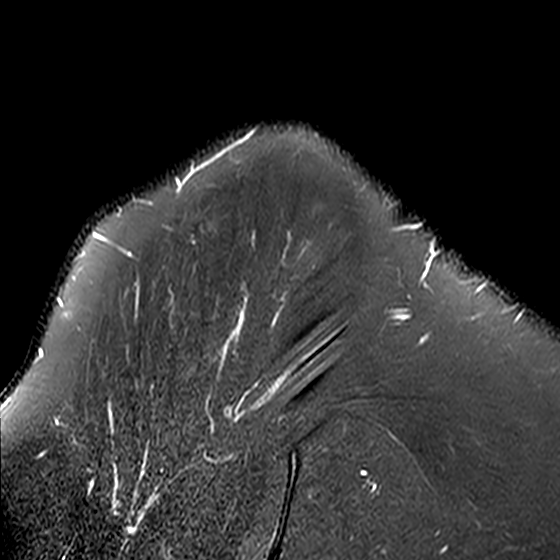
[im 5/30]
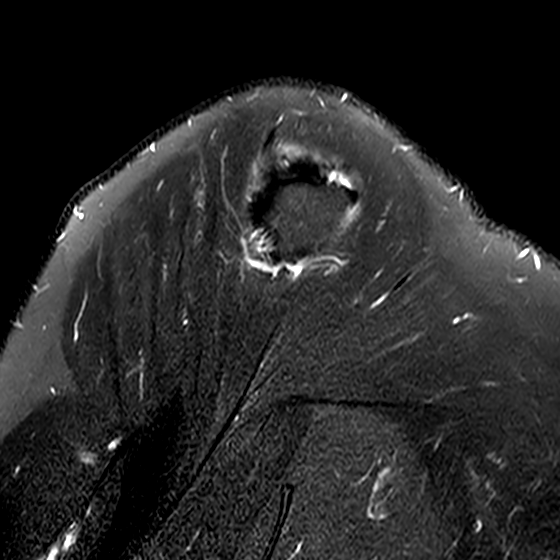
[im 10/30]
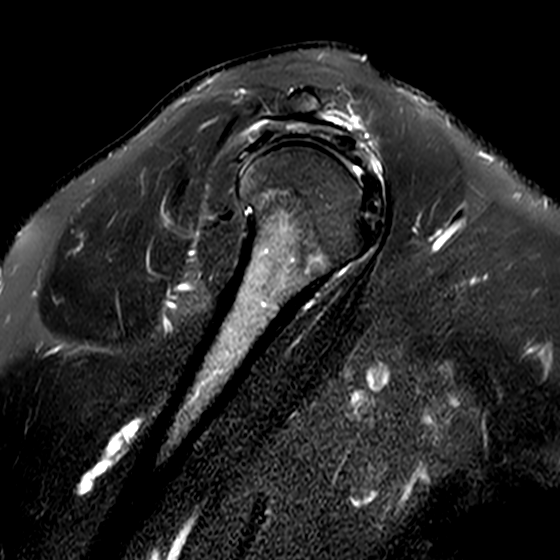
[im 15/30]
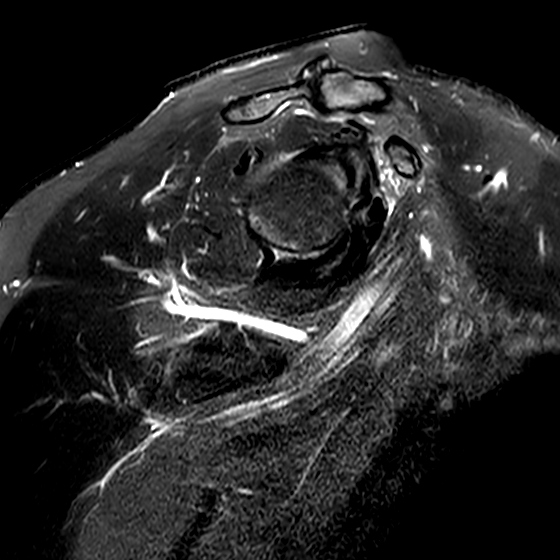
[im 20/30]
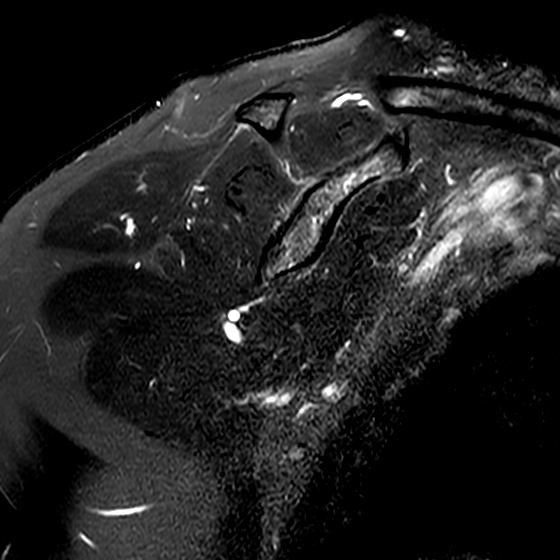
[im 25/30]
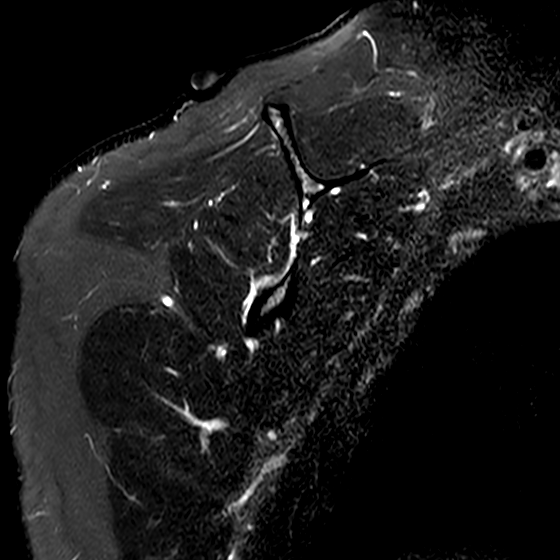

[Series 501: t1_sag right · oblique · right · 3.0mm · 0.30mm/px · 3 of 30 slices shown]
[im 5/30]
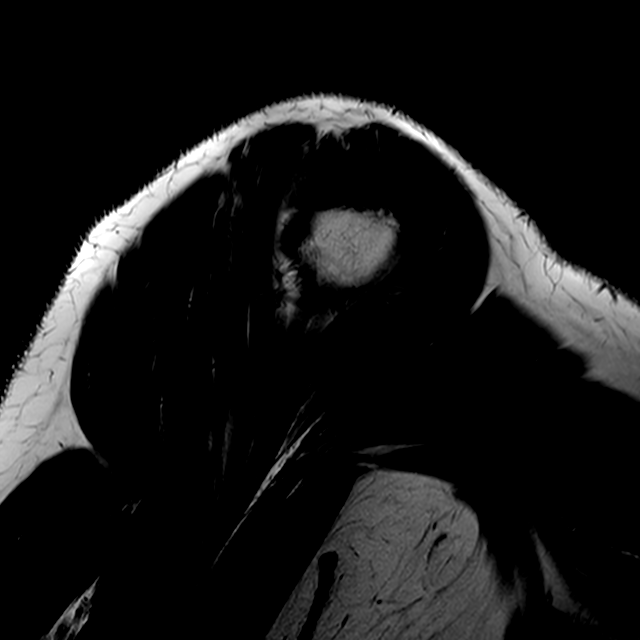
[im 15/30]
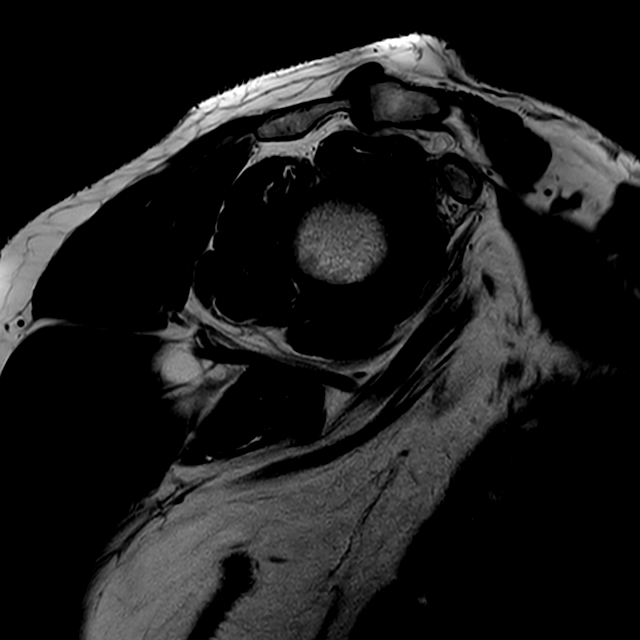
[im 25/30]
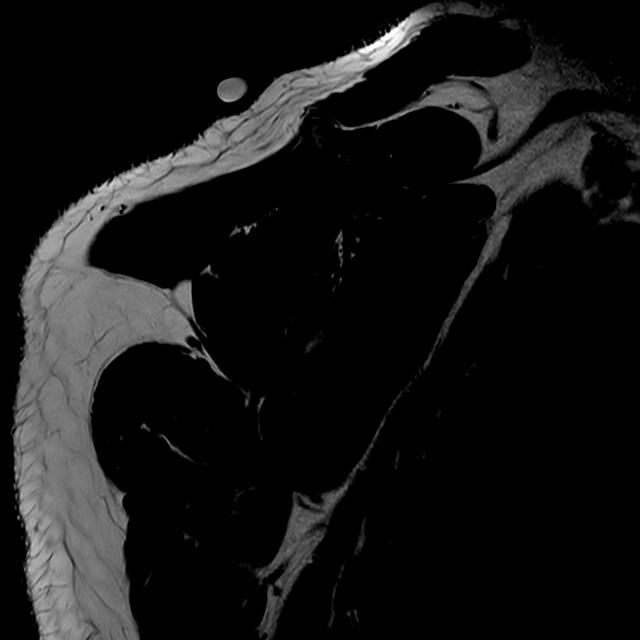

[18 of 40 positions shown; findings below may reference images not displayed]

FINDINGS: ROTATOR CUFF: High-grade partial-thickness concealed distal supraspinatus tendon 
tear measures 0.8 cm in AP dimensions and 0.4 cm in width. 0.1 cm low-grade 
concealed, intrasubstance distal infraspinatus tendon tear and small amount of 
fluid along the infraspinatus myotendinous junction. The subscapularis and teres 
minor tendons are intact. No fatty muscular atrophy. 
ACROMIOCLAVICULAR JOINT: Mild degenerative change of the acromioclavicular joint 
with mild mass effect upon the supraspinatus. The coracoacromial ligament is 
intact without prominent spurring at the acromial attachment. The 
acromioclavicular and coracoclavicular ligaments are preserved. The acromium is 
normal in morphology. 
GLENOHUMERAL JOINT: The humeral head is well located within the glenoid fossa. 
Articular cartilage is preserved.  Posterosuperior labral tear and 0.6 cm 
paralabral cyst in the [DATE] position. The intra-articular portion of the long 
head of the biceps tendon is negative. No shoulder joint effusion. 
BONES: The bone marrow signal intensity is negative for fracture. No Hill-Sachs 
defect. Subcortical cystic change of the humeral head. 
ADDITIONAL FINDINGS: Stable 2.5 x 0.7 x 1.1 cm fatty nodule in the teres minor 
muscle, consistent with lipoma. Small amount of fluid in the 
subacromial/subdeltoid bursa. Stable mild prominence of the axillary fat. 
Subcutaneous tissues are negative.
IMPRESSION: 1.  0.8 cm high-grade partial-thickness concealed distal supraspinatus tendon 
tear.  
2.  0.1 cm low-grade concealed, intrasubstance distal infraspinatus tendon tear 
and small amount of fluid along the infraspinatus myotendinous junction.  
3.  Posterosuperior labral tear and 0.6 cm paralabral cyst in the [DATE] 
position.  
4.  Mild AC joint degenerative change with mild mass effect upon the 
supraspinatus. 
5.  2.5 x 0.7 x 1.1 cm teres minor lipoma.

## 2023-03-17 IMAGING — CT CT SINUS WITHOUT CONTRAST
3 series · 13 of 47 positions shown, 15 images · non-contrast
Comparison: There are no previous exams available for comparison.

________________________________________________________________________________________________ 
CT SINUS WITHOUT CONTRAST, 03/17/2023 [DATE]: 
CLINICAL INDICATION: Chronic sinusitis, unspecified 
A search for DICOM formatted images was conducted for prior CT imaging studies 
completed at a non-affiliated media free facility.
TECHNIQUE: The paranasal sinuses were scanned without contrast on a high 
resolution CT scanner using dose reduction techniques. Routine MPR 
reconstructions were performed.

[Series 3: sinus 0.75 h30s · axial · 0.39mm/px · z∈[+1255,+1350]mm · 7 of 166 slices shown, 9 images]
[im 18/166  brain]
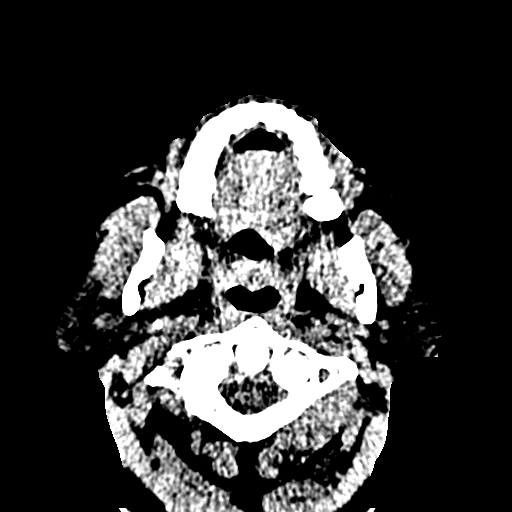
[im 18/166  bone]
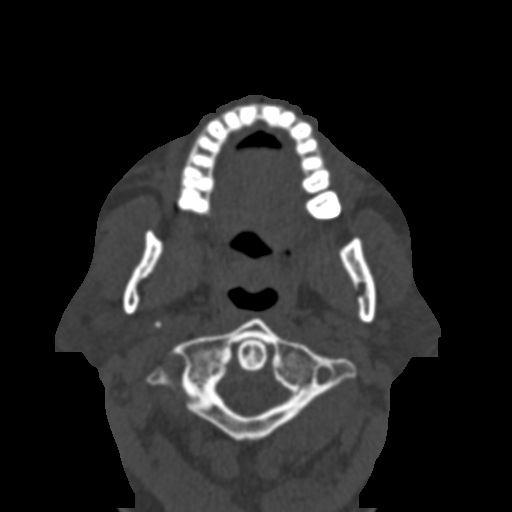
[im 40/166  bone]
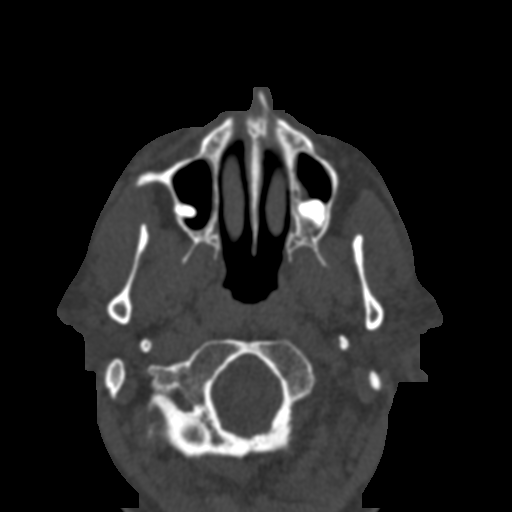
[im 63/166  bone]
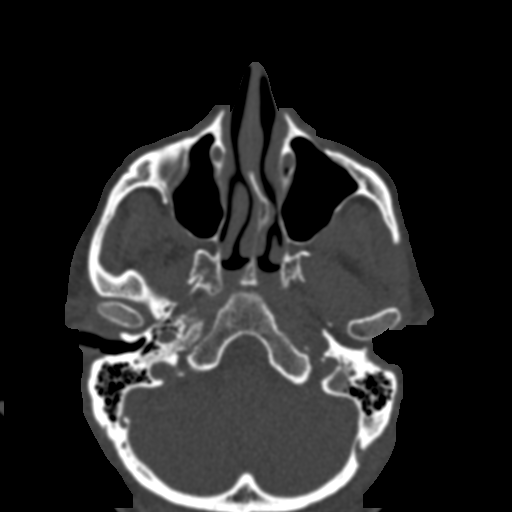
[im 86/166  bone]
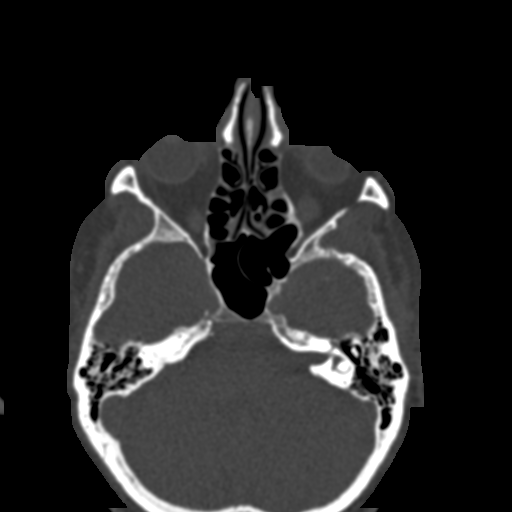
[im 109/166  brain]
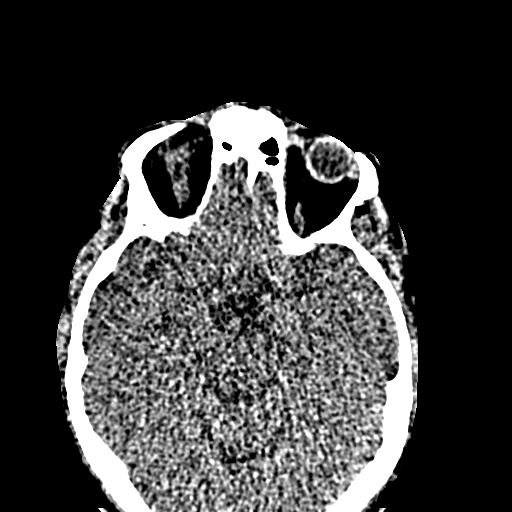
[im 109/166  bone]
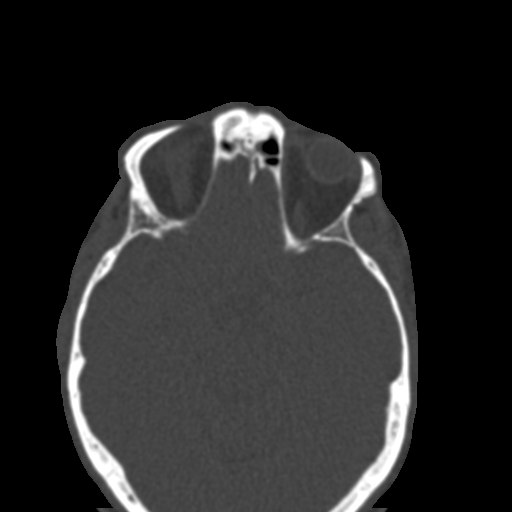
[im 131/166  bone]
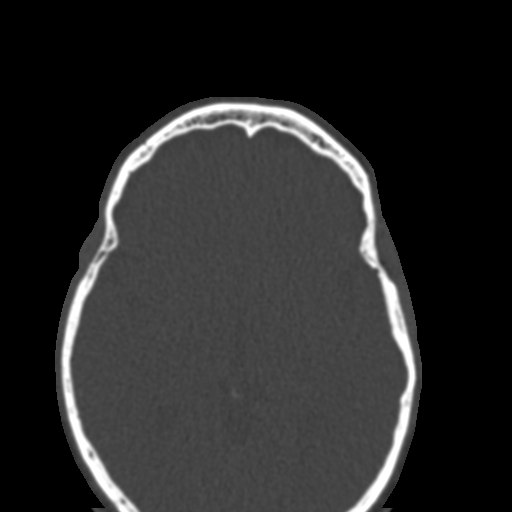
[im 154/166  bone]
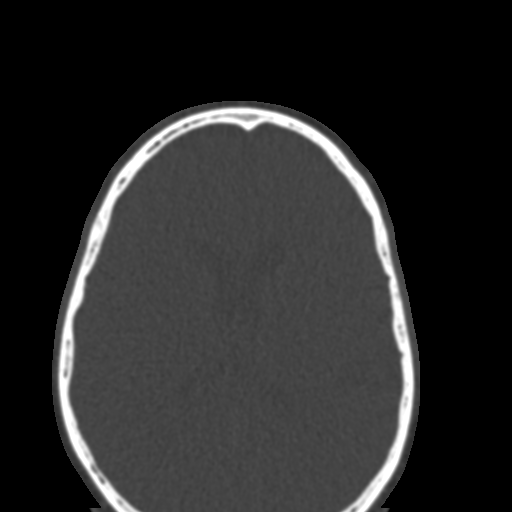

[Series 4: coronal · coronal · 0.26mm/px · 3 of 188 slices shown]
[im 63/188  bone]
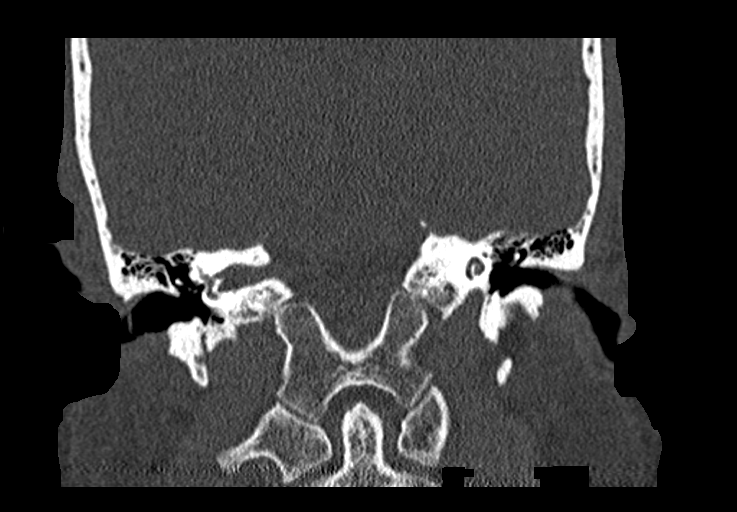
[im 84/188  bone]
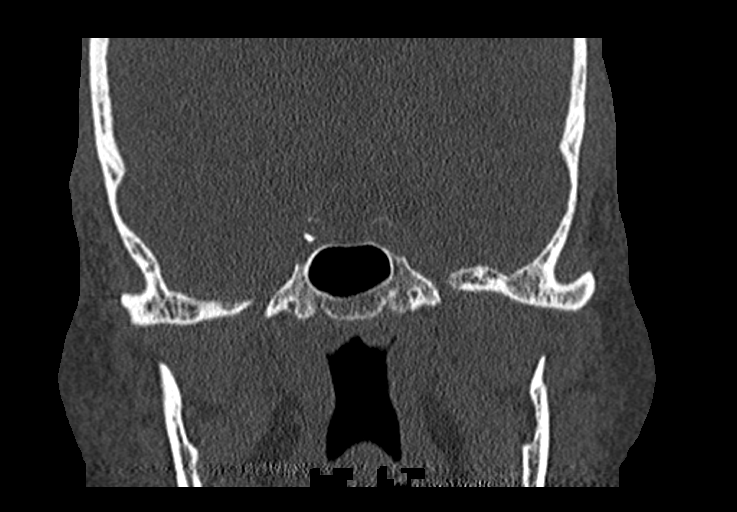
[im 104/188  bone]
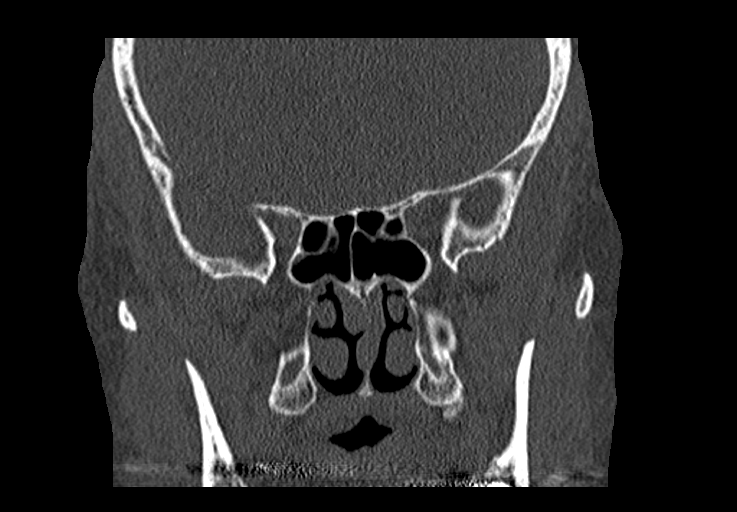

[Series 5: sagittal · sagittal · 0.25mm/px · 3 of 111 slices shown]
[im 37/111  bone]
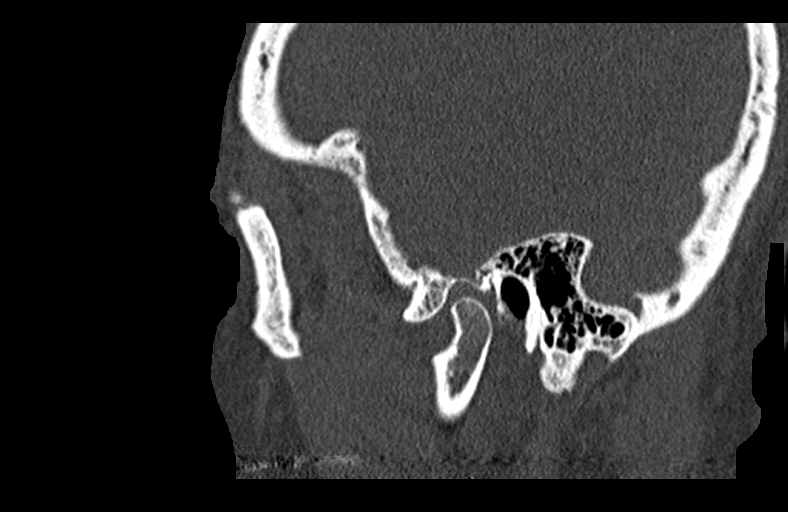
[im 56/111  bone]
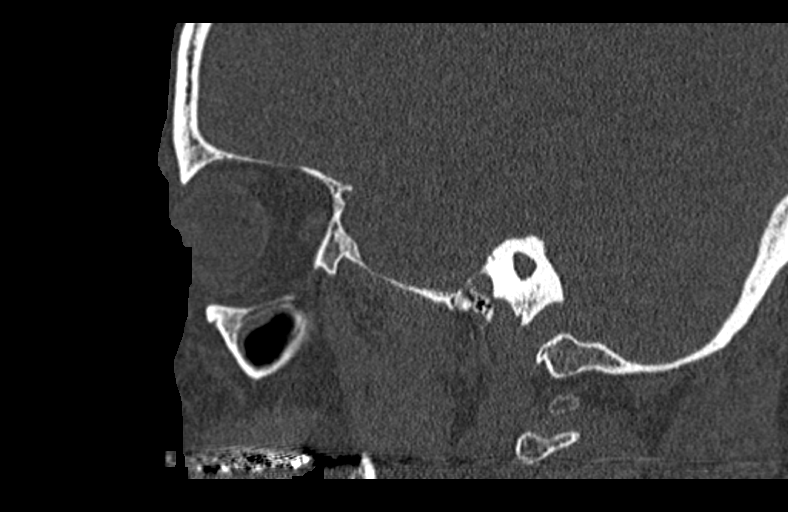
[im 74/111  bone]
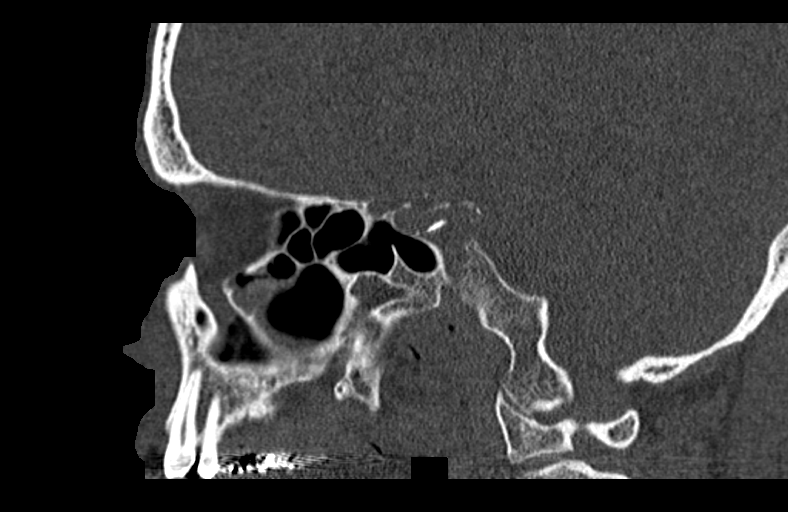

[13 of 47 positions shown; findings below may reference images not displayed]

Count of known CT and Cardiac Nuclear Medicine studies performed in the previous 
12 months = 0.
FINDINGS: -------------------------------------------------------------------------------- 
------------------------- 
SINUSES: 
FRONTAL SINUSES: Aplastic right frontal sinus. Mild mucosal thickening in the 
left frontal sinus.     
ETHMOID AIR CELLS: Mild mucosal thickening along the right anterior ethmoidal 
air cells. Mild mucosal thickening in the left ethmoidal air cells.     
SPHENOID SINUSES: Mild mucosal thickening in the right sphenoid sinus laterally. 
Left sphenoid sinus is clear.     
MAXILLARY SINUSES: Mild mucosal thickening in the right maxillary sinus. Mild 
mucosal thickening in the left maxillary sinus. 
PARANASAL SINUS DRAINAGE PATHWAYS: Patency of left frontoethmoidal recess.  
Patency of sphenoethmoidal recesses.  Patency of the ostiomeatal units. 
-------------------------------------------------------------------------------- 
----------------------- 
OTHER: 

NASAL CAVITY / SKULL BASE: Nasal septum is deviated towards the left side noting 
small left-sided nasal septal spur.  Skull base is intact.  Mastoid air cells 
and middle ear cavities are clear. 
NON-SINUS STRUCTURES: No abnormality of the visualized orbits or intracranial 
compartments. 

-------------------------------------------------------------------------------- 
---------------------
IMPRESSION: 1.  Mild mucosal changes as above. 
2.  No air-fluid levels or frothy material to suggest acute sinus disease. 
RADIATION DOSE REDUCTION: All CT scans are performed using radiation dose 
reduction techniques, when applicable.  Technical factors are evaluated and 
adjusted to ensure appropriate moderation of exposure.  Automated dose 
management technology is applied to adjust the radiation doses to minimize 
exposure while achieving diagnostic quality images.

## 2023-04-08 IMAGING — MR MRI LUMBAR SPINE WITHOUT CONTRAST
6 of 8 series · 15 of 48 positions shown · IV contrast (gadolinium)
Comparison: MRI lumbar spine December 17, 2020.

________________________________________________________________________________________________ 
MRI LUMBAR SPINE WITHOUT CONTRAST, 04/08/2023 [DATE]: 
CLINICAL INDICATION: Radiculopathy, Lumbar Region , low back pain with left leg 
radiculopathy. Lumbar ablation a month ago.
TECHNIQUE: Multiplanar, multiecho position MR images of the lumbar spine were 
performed without intravenous gadolinium enhancement. Patient was scanned on a 
1.5T magnet

[Series 101: survey · axial · 10.0mm · 1.25mm/px · 1 of 10 slices shown]
[im 1/10]
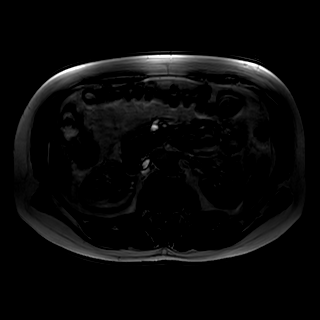

[Series 201: t2w_cor-surv · coronal · 6.0mm · 0.62mm/px · 1 of 10 slices shown]
[im 1/10]
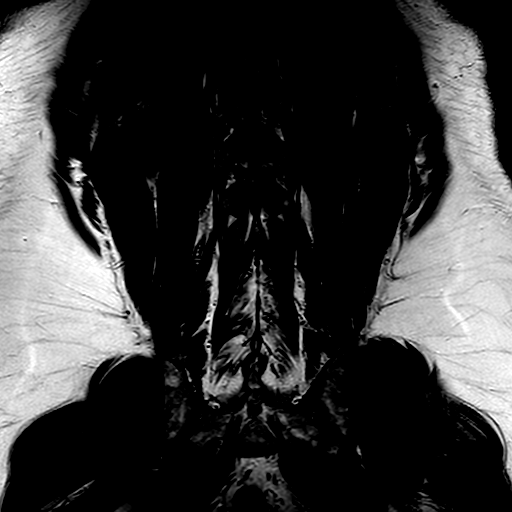

[Series 301: t1_tse_sag · sagittal · 4.0mm · 0.44mm/px · 2 of 19 slices shown]
[im 1/19]
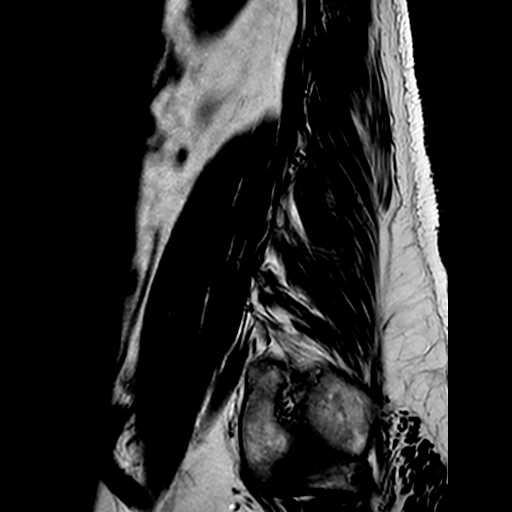
[im 19/19]
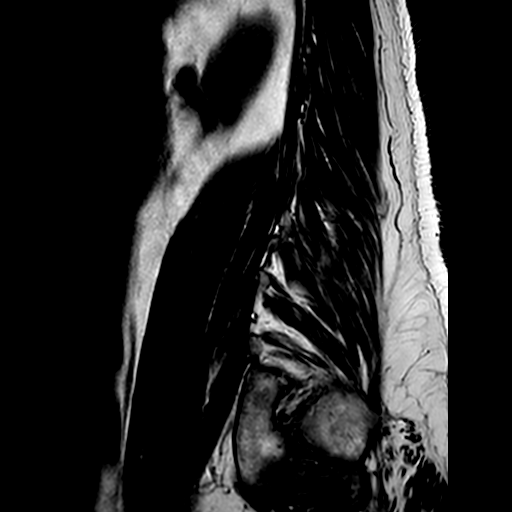

[Series 401: stir_sag · sagittal · 4.0mm · 0.52mm/px · 3 of 19 slices shown]
[im 1/19]
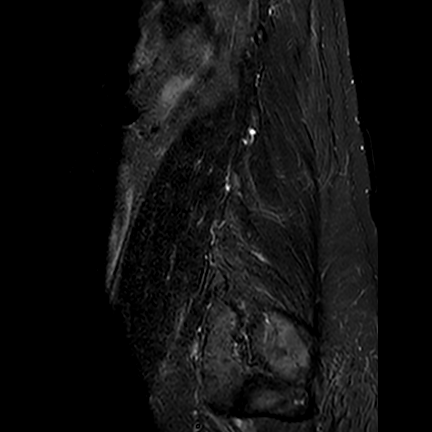
[im 10/19]
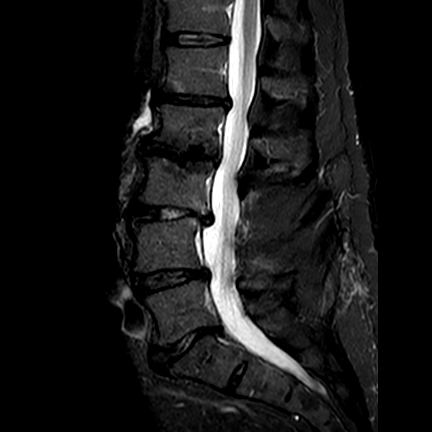
[im 19/19]
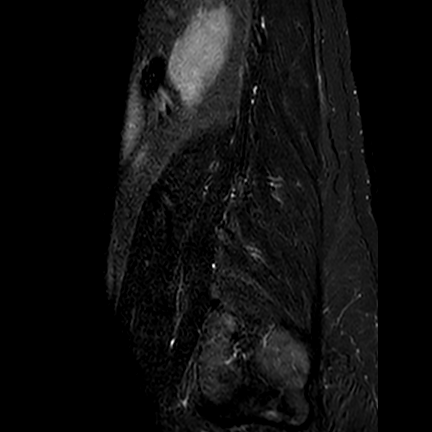

[Series 502: (id) view_ax mpr · axial · 1.0mm · 0.25mm/px · z∈[-168,-144]mm · 2 of 120 slices shown]
[im 8/120]
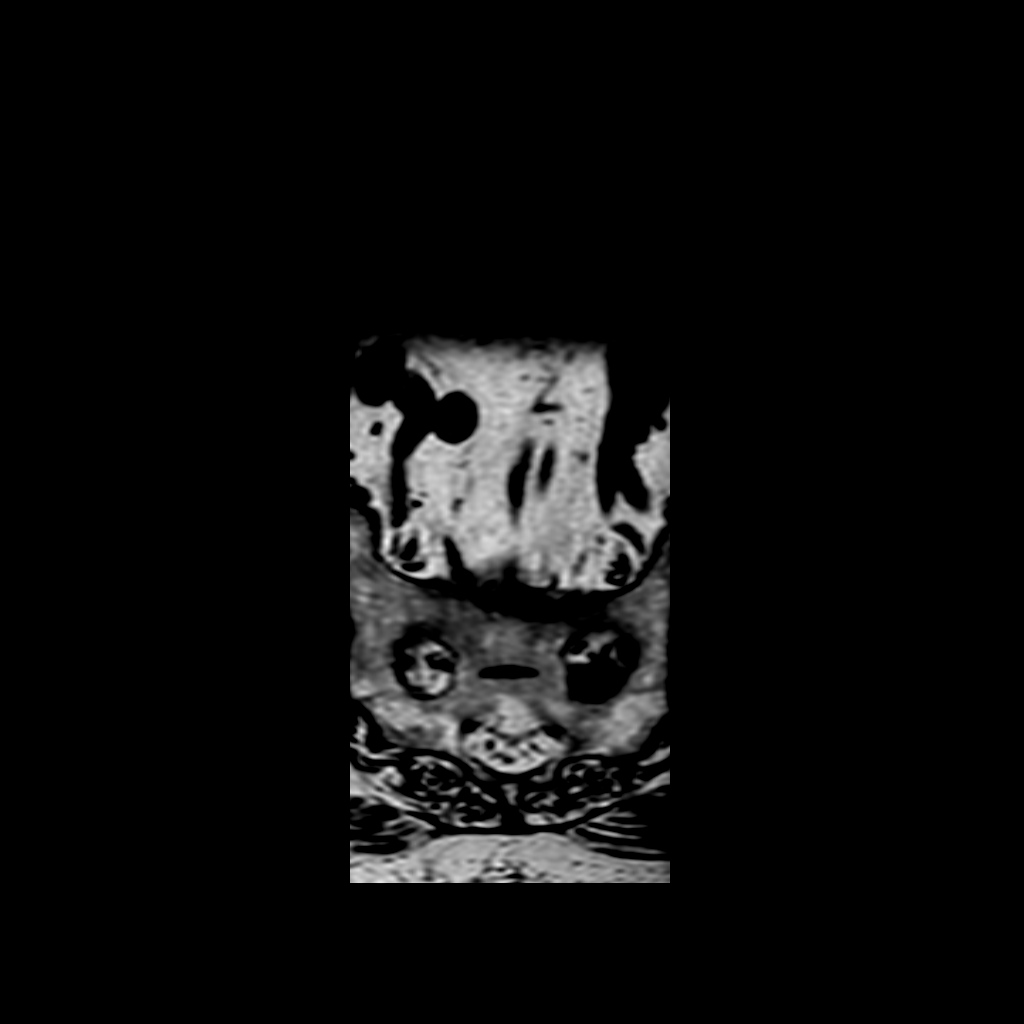
[im 24/120]
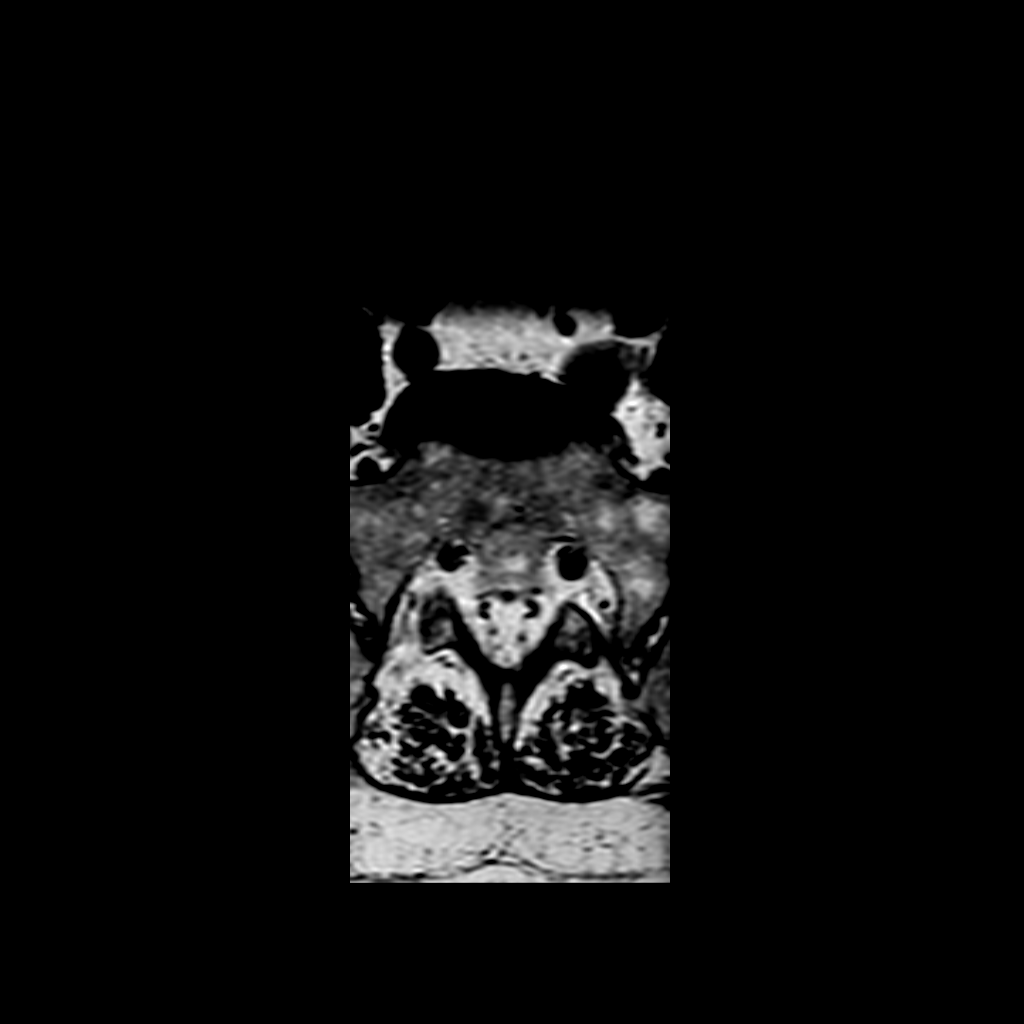

[Series 701: T1 · axial · 4.0mm · 0.39mm/px · z∈[-187,-5]mm · 6 of 42 slices shown]
[im 1/42]
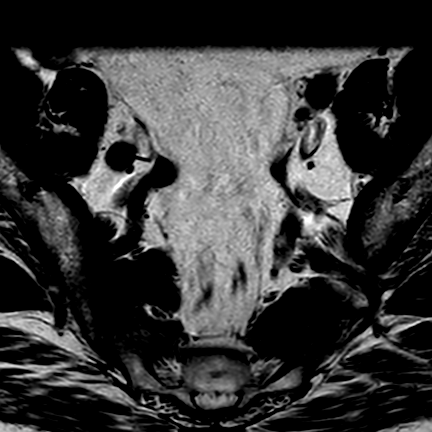
[im 9/42]
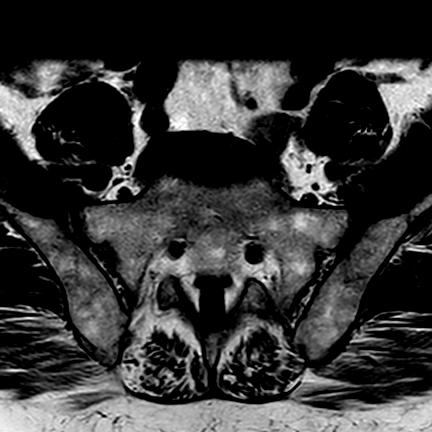
[im 17/42]
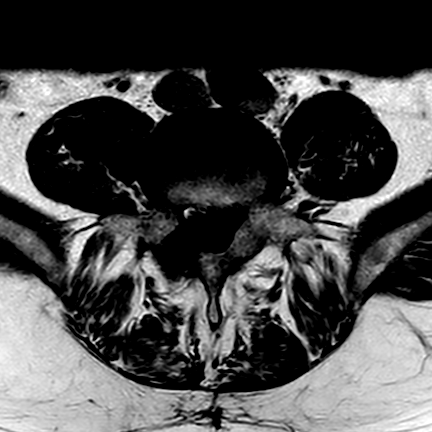
[im 25/42]
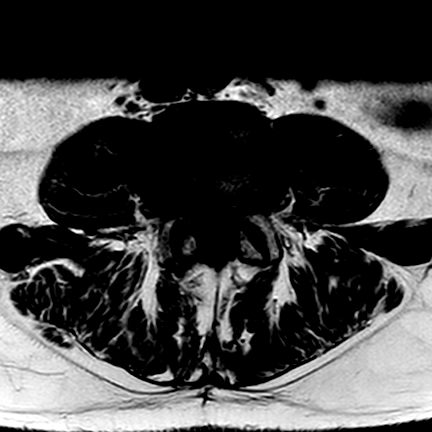
[im 33/42]
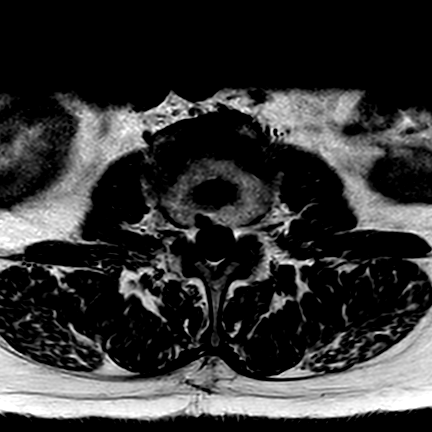
[im 42/42]
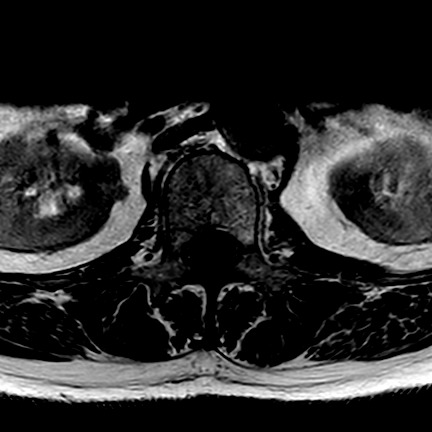

[15 of 48 positions shown; findings below may reference images not displayed]

FINDINGS: Previous lumbar laminectomy L3-L4 and L4-L5. 
-------------------------------------------------------------------------------- 
------ 
GENERAL: 
Nomenclature is based on 5 lumbar type vertebral bodies.     
ALIGNMENT: Normal coronal alignment. Stepwise grade 1 retrolisthesis L2 on L3 
and L3 on L4. Grade 1 retrolisthesis L5 on S1. Alignment stable. 
VERTEBRAL BODY HEIGHT: Normal.  
MARROW SIGNAL: No focal suspect signal abnormality. 
CORD SIGNAL: Normal distal spinal cord and cauda equina. Conus medullaris 
terminates at inferior L1. 
ADDITIONAL FINDINGS: None. 
Modic I-II: L2-L3, L3-L4 
Ligamentum Flavum > 2.5 mm: All except the laminectomy sites. 
-------------------------------------------------------------------------------- 
------ 
SEGMENTAL: 
T12-L1: Loss of disc height posteriorly. Loss of disc signal. Minimal annular 
bulge. Otherwise normal. Stable.  
L1-L2: Loss of disc height and signal. Annular bulge with mild canal stenosis. 
Foramina patent. Normal facets. Stable. 
L2-L3: Moderate loss of disc height. Loss of disc signal. Schmorls node with 
Modic type I endplate signal changes. Mild canal stenosis. There is left lateral 
recess narrowing by posterior osteophytic ridging. Mild bilateral foramina and. 
Stable. 
L3-L4: Moderate loss of disc height posterior early. Loss of disc signal. 
Annular bulge. Central disc protrusion slightly indents the ventral thecal sac 
although the canal is patent. Severe right and moderately severe left foraminal 
narrowing. Facet arthropathy. Stable. 
L4-L5: Mild loss of disc height and signal. Annular bulge. Canal patent. There 
is lateral recess narrowing bilaterally. Facet arthropathy. Moderate bilateral 
foraminal narrowing. Stable. 
L5-S1: Posterior annular fissure. Severe loss of disc height posteriorly. Loss 
of disc signal. Disc unroofing. Canal patent. Very slight narrowing right 
lateral recess region. Mild facet arthropathy. Moderate bilateral foraminal 
narrowing. Stable. 
-------------------------------------------------------------------------------- 
------
IMPRESSION: Previous lumbar laminectomy L3-L4 and L4-L5. 
Stable lumbar spondylosis. No significant or critical central canal narrowing. 
Variable lateral recess and foraminal narrowing detailed above, stable.
